# Patient Record
Sex: Female | Born: 1985 | Race: Black or African American | Hispanic: No | Smoking: Never smoker
Health system: Southern US, Community
[De-identification: ages and names within clinical notes are randomized; demographics above are authoritative.]

## PROBLEM LIST (undated history)

## (undated) DIAGNOSIS — O24419 Gestational diabetes mellitus in pregnancy, unspecified control: Secondary | ICD-10-CM

## (undated) DIAGNOSIS — E119 Type 2 diabetes mellitus without complications: Secondary | ICD-10-CM

## (undated) DIAGNOSIS — Z862 Personal history of diseases of the blood and blood-forming organs and certain disorders involving the immune mechanism: Secondary | ICD-10-CM

## (undated) HISTORY — PX: HERNIA REPAIR: SHX51

## (undated) HISTORY — DX: Type 2 diabetes mellitus without complications: E11.9

## (undated) HISTORY — PX: WISDOM TOOTH EXTRACTION: SHX21

---

## 2011-08-25 DIAGNOSIS — A6000 Herpesviral infection of urogenital system, unspecified: Secondary | ICD-10-CM | POA: Insufficient documentation

## 2011-08-25 DIAGNOSIS — J309 Allergic rhinitis, unspecified: Secondary | ICD-10-CM | POA: Insufficient documentation

## 2011-11-15 DIAGNOSIS — O34219 Maternal care for unspecified type scar from previous cesarean delivery: Secondary | ICD-10-CM | POA: Insufficient documentation

## 2011-11-15 DIAGNOSIS — Z803 Family history of malignant neoplasm of breast: Secondary | ICD-10-CM | POA: Insufficient documentation

## 2012-11-13 DIAGNOSIS — N920 Excessive and frequent menstruation with regular cycle: Secondary | ICD-10-CM | POA: Insufficient documentation

## 2012-11-13 HISTORY — DX: Excessive and frequent menstruation with regular cycle: N92.0

## 2015-12-25 ENCOUNTER — Encounter (HOSPITAL_COMMUNITY): Payer: Self-pay | Admitting: Emergency Medicine

## 2015-12-25 DIAGNOSIS — R102 Pelvic and perineal pain: Secondary | ICD-10-CM | POA: Insufficient documentation

## 2015-12-25 DIAGNOSIS — N939 Abnormal uterine and vaginal bleeding, unspecified: Secondary | ICD-10-CM | POA: Diagnosis present

## 2015-12-25 LAB — COMPREHENSIVE METABOLIC PANEL
ALT: 13 U/L — AB (ref 14–54)
AST: 15 U/L (ref 15–41)
Albumin: 3.9 g/dL (ref 3.5–5.0)
Alkaline Phosphatase: 59 U/L (ref 38–126)
Anion gap: 6 (ref 5–15)
BUN: 10 mg/dL (ref 6–20)
CHLORIDE: 107 mmol/L (ref 101–111)
CO2: 26 mmol/L (ref 22–32)
CREATININE: 0.71 mg/dL (ref 0.44–1.00)
Calcium: 9 mg/dL (ref 8.9–10.3)
GFR calc non Af Amer: >60 mL/min (ref >60)
Glucose, Bld: 138 mg/dL — ABNORMAL HIGH (ref 65–99)
POTASSIUM: 3.8 mmol/L (ref 3.5–5.1)
SODIUM: 139 mmol/L (ref 135–145)
Total Bilirubin: 0.8 mg/dL (ref 0.3–1.2)
Total Protein: 6.4 g/dL — ABNORMAL LOW (ref 6.5–8.1)

## 2015-12-25 LAB — CBC WITH DIFFERENTIAL/PLATELET
BASOS ABS: 0 10*3/uL (ref 0.0–0.1)
Basophils Relative: 0 %
EOS ABS: 0.1 10*3/uL (ref 0.0–0.7)
EOS PCT: 1 %
HCT: 36.6 % (ref 36.0–46.0)
Hemoglobin: 12.2 g/dL (ref 12.0–15.0)
Lymphocytes Relative: 29 %
Lymphs Abs: 2.7 10*3/uL (ref 0.7–4.0)
MCH: 30.8 pg (ref 26.0–34.0)
MCHC: 33.3 g/dL (ref 30.0–36.0)
MCV: 92.4 fL (ref 78.0–100.0)
MONO ABS: 0.6 10*3/uL (ref 0.1–1.0)
Monocytes Relative: 6 %
Neutro Abs: 6.1 10*3/uL (ref 1.7–7.7)
Neutrophils Relative %: 64 %
PLATELETS: 282 10*3/uL (ref 150–400)
RBC: 3.96 MIL/uL (ref 3.87–5.11)
RDW: 12.4 % (ref 11.5–15.5)
WBC: 9.4 10*3/uL (ref 4.0–10.5)

## 2015-12-25 LAB — I-STAT BETA HCG BLOOD, ED (MC, WL, AP ONLY): I-stat hCG, quantitative: <5 m[IU]/mL (ref <5)

## 2015-12-25 NOTE — ED Triage Notes (Signed)
Pt c/o vaginal bleeding, onset earlier today.   St's she has been passing large clots. Also c/o cramping

## 2015-12-26 ENCOUNTER — Emergency Department (HOSPITAL_COMMUNITY)
Admission: EM | Admit: 2015-12-26 | Discharge: 2015-12-26 | Disposition: A | Discharge status: Home / Self Care | Payer: Managed Care, Other (non HMO) | Attending: Emergency Medicine | Admitting: Emergency Medicine

## 2015-12-26 ENCOUNTER — Emergency Department (HOSPITAL_COMMUNITY): Payer: Managed Care, Other (non HMO)

## 2015-12-26 DIAGNOSIS — N939 Abnormal uterine and vaginal bleeding, unspecified: Secondary | ICD-10-CM

## 2015-12-26 LAB — HEMOGLOBIN AND HEMATOCRIT, BLOOD
HCT: 34.2 % — ABNORMAL LOW (ref 36.0–46.0)
Hemoglobin: 11.4 g/dL — ABNORMAL LOW (ref 12.0–15.0)

## 2015-12-26 NOTE — ED Notes (Signed)
Pelvic cart/stirrups set up at bedside for EDP pelvic examination .

## 2015-12-26 NOTE — ED Notes (Signed)
Pelvic cart set up outside of room

## 2015-12-26 NOTE — ED Provider Notes (Signed)
[facility] DEPT Provider Note   CSN: [id number] Arrival date & time: 12/25/15  2136   History   Chief Complaint Chief Complaint  Patient presents with  . Vaginal Bleeding    HPI Alexa Rosales is a 30 y.o. female.  HPI   Patient has no significant PMH. She presents to the ER For significant amount of vaginal bleeding. She was seen by her primary provider yesterday and had a pelvic exam done with vaginal swabs checking for STDs as well as a wet prep, neg UA. She states that she has bacterial vaginosis and has begun treatment for it. She used to have an IUD but it came out 3 months ago, her last menstrual period was 2 weeks ago. Today she started bleeding a large amount of blood with clots in it. She states going through 2 large pads every hour which is very unusual for her. She says this has never happened before. She has not been feeling weak, dizzy, nauseous. She denies having any fever, chills, confusion. She does have a mild amount of pelvic cramping other than that denies having any pain.  History reviewed. No pertinent past medical history.  There are no active problems to display for this patient.   Past Surgical History:  Procedure Laterality Date  . CESAREAN SECTION      OB History    No data available       Home Medications    Prior to Admission medications   Not on File    Family History No family history on file.  Social History Social History  Substance Use Topics  . Smoking status: Never Smoker  . Smokeless tobacco: Never Used  . Alcohol use Yes     Allergies   Review of patient's allergies indicates not on file.   Review of Systems Review of Systems  Review of Systems All other systems negative except as documented in the HPI. All pertinent positives and negatives as reviewed in the HPI.  Physical Exam Updated Vital Signs BP 108/65 (BP Location: Right Arm)   Pulse 83   Temp 98 F (36.7 C) (Oral)   Resp 16   Ht 5\' 5"  (1.651 m)    Wt 81.2 kg   LMP 12/25/2015 (Exact Date)   SpO2 98%   BMI 29.79 kg/m   Physical Exam  Constitutional: She appears well-developed and well-nourished. No distress.  HENT:  Head: Normocephalic and atraumatic.  Eyes: Pupils are equal, round, and reactive to light.  Neck: Normal range of motion. Neck supple.  Cardiovascular: Normal rate and regular rhythm.   Pulmonary/Chest: Effort normal.  Abdominal: Soft. Bowel sounds are normal. There is no tenderness.  Genitourinary: There is bleeding in the vagina.  Genitourinary Comments: Large amount of bleeding with coagulated blood within vaginal vault. No tissue clots noted. Cervix is closed. No wounds or lesions noted. No source of bleeding noted.  Neurological: She is alert.  Skin: Skin is warm and dry.  Nursing note and vitals reviewed.    ED Treatments / Results  Labs (all labs ordered are listed, but only abnormal results are displayed) Labs Reviewed  COMPREHENSIVE METABOLIC PANEL - Abnormal; Notable for the following:       Result Value   Glucose, Bld 138 (*)    Total Protein 6.4 (*)    ALT 13 (*)    All other components within normal limits  HEMOGLOBIN AND HEMATOCRIT, BLOOD - Abnormal; Notable for the following:    Hemoglobin 11.4 (*)  HCT 34.2 (*)    All other components within normal limits  CBC WITH DIFFERENTIAL/PLATELET  I-STAT BETA HCG BLOOD, ED ([facility], [facility], [facility] ONLY)    EKG  EKG Interpretation None       Radiology US Transvaginal Non-ob  Result Date: 12/26/2015 CLINICAL DATA:  30 year old female with vaginal bleeding and tissue passing. LMP 12/25/2015 EXAM: TRANSABDOMINAL AND TRANSVAGINAL ULTRASOUND OF PELVIS DOPPLER ULTRASOUND OF OVARIES TECHNIQUE: Both transabdominal and transvaginal ultrasound examinations of the pelvis were performed. Transabdominal technique was performed for global imaging of the pelvis including uterus, ovaries, adnexal regions, and pelvic cul-de-sac. It was necessary to proceed with endovaginal  exam following the transabdominal exam to visualize the endometrium and the ovaries. Color and duplex Doppler ultrasound was utilized to evaluate blood flow to the ovaries. COMPARISON:  None. FINDINGS: Uterus Measurements: 9.5 x 5.4 x 6.0 cm. No fibroids or other mass visualized. Endometrium Thickness: 23 mm.  No focal abnormality visualized. Right ovary Measurements: 4.5 x 2.6 x 3.1 cm. Normal appearance/no adnexal mass. Left ovary Measurements: 5.5 x 3.2 x 3.7 cm. There is a 3.0 x 2.5 x 2.6 cm cyst and a probable additional corpus luteum in the left ovary. Pulsed Doppler evaluation of both ovaries demonstrates normal low-resistance arterial and venous waveforms. Other findings Trace free fluid within the pelvis. IMPRESSION: Unremarkable pelvic ultrasound. Doppler detected flow to both ovaries. Electronically Signed   By: Anner Crete M.D.   On: 12/26/2015 03:29   US Pelvis Complete  Result Date: 12/26/2015 CLINICAL DATA:  30 year old female with vaginal bleeding and tissue passing. LMP 12/25/2015 EXAM: TRANSABDOMINAL AND TRANSVAGINAL ULTRASOUND OF PELVIS DOPPLER ULTRASOUND OF OVARIES TECHNIQUE: Both transabdominal and transvaginal ultrasound examinations of the pelvis were performed. Transabdominal technique was performed for global imaging of the pelvis including uterus, ovaries, adnexal regions, and pelvic cul-de-sac. It was necessary to proceed with endovaginal exam following the transabdominal exam to visualize the endometrium and the ovaries. Color and duplex Doppler ultrasound was utilized to evaluate blood flow to the ovaries. COMPARISON:  None. FINDINGS: Uterus Measurements: 9.5 x 5.4 x 6.0 cm. No fibroids or other mass visualized. Endometrium Thickness: 23 mm.  No focal abnormality visualized. Right ovary Measurements: 4.5 x 2.6 x 3.1 cm. Normal appearance/no adnexal mass. Left ovary Measurements: 5.5 x 3.2 x 3.7 cm. There is a 3.0 x 2.5 x 2.6 cm cyst and a probable additional corpus luteum in the  left ovary. Pulsed Doppler evaluation of both ovaries demonstrates normal low-resistance arterial and venous waveforms. Other findings Trace free fluid within the pelvis. IMPRESSION: Unremarkable pelvic ultrasound. Doppler detected flow to both ovaries. Electronically Signed   By: Anner Crete M.D.   On: 12/26/2015 03:29   [place] Art/ven Flow Abd Pelv Doppler  Result Date: 12/26/2015 CLINICAL DATA:  30 year old female with vaginal bleeding and tissue passing. LMP 12/25/2015 EXAM: TRANSABDOMINAL AND TRANSVAGINAL ULTRASOUND OF PELVIS DOPPLER ULTRASOUND OF OVARIES TECHNIQUE: Both transabdominal and transvaginal ultrasound examinations of the pelvis were performed. Transabdominal technique was performed for global imaging of the pelvis including uterus, ovaries, adnexal regions, and pelvic cul-de-sac. It was necessary to proceed with endovaginal exam following the transabdominal exam to visualize the endometrium and the ovaries. Color and duplex Doppler ultrasound was utilized to evaluate blood flow to the ovaries. COMPARISON:  None. FINDINGS: Uterus Measurements: 9.5 x 5.4 x 6.0 cm. No fibroids or other mass visualized. Endometrium Thickness: 23 mm.  No focal abnormality visualized. Right ovary Measurements: 4.5 x 2.6 x 3.1 cm. Normal appearance/no adnexal mass.  Left ovary Measurements: 5.5 x 3.2 x 3.7 cm. There is a 3.0 x 2.5 x 2.6 cm cyst and a probable additional corpus luteum in the left ovary. Pulsed Doppler evaluation of both ovaries demonstrates normal low-resistance arterial and venous waveforms. Other findings Trace free fluid within the pelvis. IMPRESSION: Unremarkable pelvic ultrasound. Doppler detected flow to both ovaries. Electronically Signed   By: Anner Crete M.D.   On: 12/26/2015 03:29    Procedures Procedures (including critical care time)  Medications Ordered in ED Medications - No data to display   Initial Impression / Assessment and Plan / ED Course  I have reviewed the triage  vital signs and the nursing notes.  Pertinent labs & imaging results that were available during my care of the patient were reviewed by me and considered in my medical decision making (see chart for details).  Clinical Course    The patient's ultrasounds are unremarkable. During the pelvic exam I did not see any source of bleeding and the cervix is closed. She is not having any pain. Her IUD fell out a few months ago and her bleeding may be hormonal. She states that while in the emergency department the bleeding has significantly slowed down. I rechecked her hemoglobin and it did not drop significantly. Her vital signs remained stable she is well-appearing. She has a gynecologist that she will follow up with on Monday.  Medications - No data to display  I discussed results, diagnoses and plan with Lorrin Jackson. They voice there understanding and questions were answered. We discussed follow-up recommendations and return precautions.   Final Clinical Impressions(s) / ED Diagnoses   Final diagnoses:  Vaginal bleeding  Abnormal uterine bleeding (AUB)    New Prescriptions New Prescriptions   No medications on file     Delos Haring, PA-C 12/26/15 [place], DO 12/26/15 [id number]

## 2015-12-26 NOTE — ED Notes (Signed)
Pt went to US  

## 2015-12-29 DIAGNOSIS — N939 Abnormal uterine and vaginal bleeding, unspecified: Secondary | ICD-10-CM | POA: Insufficient documentation

## 2015-12-29 HISTORY — DX: Abnormal uterine and vaginal bleeding, unspecified: N93.9

## 2017-02-25 DIAGNOSIS — J00 Acute nasopharyngitis [common cold]: Secondary | ICD-10-CM | POA: Diagnosis not present

## 2017-02-27 DIAGNOSIS — J209 Acute bronchitis, unspecified: Secondary | ICD-10-CM | POA: Diagnosis not present

## 2017-07-07 ENCOUNTER — Encounter: Payer: Self-pay | Admitting: Family Medicine

## 2017-07-07 ENCOUNTER — Ambulatory Visit (INDEPENDENT_AMBULATORY_CARE_PROVIDER_SITE_OTHER): Payer: 59 | Admitting: Family Medicine

## 2017-07-07 VITALS — BP 114/66 | HR 86 | Temp 98.5°F | Ht 64.25 in | Wt 180.3 lb

## 2017-07-07 DIAGNOSIS — Z13220 Encounter for screening for lipoid disorders: Secondary | ICD-10-CM | POA: Diagnosis not present

## 2017-07-07 DIAGNOSIS — N898 Other specified noninflammatory disorders of vagina: Secondary | ICD-10-CM | POA: Diagnosis not present

## 2017-07-07 DIAGNOSIS — B9689 Other specified bacterial agents as the cause of diseases classified elsewhere: Secondary | ICD-10-CM

## 2017-07-07 DIAGNOSIS — N760 Acute vaginitis: Secondary | ICD-10-CM

## 2017-07-07 DIAGNOSIS — Z0000 Encounter for general adult medical examination without abnormal findings: Secondary | ICD-10-CM

## 2017-07-07 DIAGNOSIS — Z131 Encounter for screening for diabetes mellitus: Secondary | ICD-10-CM | POA: Diagnosis not present

## 2017-07-07 DIAGNOSIS — E669 Obesity, unspecified: Secondary | ICD-10-CM

## 2017-07-07 DIAGNOSIS — D649 Anemia, unspecified: Secondary | ICD-10-CM | POA: Diagnosis not present

## 2017-07-07 DIAGNOSIS — Z1389 Encounter for screening for other disorder: Secondary | ICD-10-CM | POA: Diagnosis not present

## 2017-07-07 DIAGNOSIS — Z7689 Persons encountering health services in other specified circumstances: Secondary | ICD-10-CM

## 2017-07-07 LAB — LIPID PANEL
CHOL/HDL RATIO: 4
Cholesterol: 180 mg/dL (ref 0–200)
HDL: 50.4 mg/dL (ref >39.00)
LDL CALC: 100 mg/dL — AB (ref 0–99)
NONHDL: 129.91
TRIGLYCERIDES: 149 mg/dL (ref 0.0–149.0)
VLDL: 29.8 mg/dL (ref 0.0–40.0)

## 2017-07-07 LAB — COMPREHENSIVE METABOLIC PANEL
ALT: 10 U/L (ref 0–35)
AST: 13 U/L (ref 0–37)
Albumin: 4.1 g/dL (ref 3.5–5.2)
Alkaline Phosphatase: 57 U/L (ref 39–117)
BUN: 14 mg/dL (ref 6–23)
CHLORIDE: 105 meq/L (ref 96–112)
CO2: 26 meq/L (ref 19–32)
Calcium: 9.2 mg/dL (ref 8.4–10.5)
Creatinine, Ser: 0.76 mg/dL (ref 0.40–1.20)
GFR: 112.69 mL/min (ref >60.00)
GLUCOSE: 95 mg/dL (ref 70–99)
POTASSIUM: 3.8 meq/L (ref 3.5–5.1)
SODIUM: 138 meq/L (ref 135–145)
Total Bilirubin: 0.6 mg/dL (ref 0.2–1.2)
Total Protein: 7.3 g/dL (ref 6.0–8.3)

## 2017-07-07 LAB — CBC WITH DIFFERENTIAL/PLATELET
BASOS PCT: 0.4 % (ref 0.0–3.0)
Basophils Absolute: 0 10*3/uL (ref 0.0–0.1)
Eosinophils Absolute: 0.1 10*3/uL (ref 0.0–0.7)
Eosinophils Relative: 0.9 % (ref 0.0–5.0)
HCT: 35.8 % — ABNORMAL LOW (ref 36.0–46.0)
HEMOGLOBIN: 12.1 g/dL (ref 12.0–15.0)
Lymphocytes Relative: 29.6 % (ref 12.0–46.0)
Lymphs Abs: 2.1 10*3/uL (ref 0.7–4.0)
MCHC: 33.7 g/dL (ref 30.0–36.0)
MCV: 89.9 fl (ref 78.0–100.0)
MONOS PCT: 7.5 % (ref 3.0–12.0)
Monocytes Absolute: 0.5 10*3/uL (ref 0.1–1.0)
NEUTROS ABS: 4.4 10*3/uL (ref 1.4–7.7)
Neutrophils Relative %: 61.6 % (ref 43.0–77.0)
PLATELETS: 371 10*3/uL (ref 150.0–400.0)
RBC: 3.99 Mil/uL (ref 3.87–5.11)
RDW: 13.4 % (ref 11.5–15.5)
WBC: 7.1 10*3/uL (ref 4.0–10.5)

## 2017-07-07 LAB — HEMOGLOBIN A1C: Hgb A1c MFr Bld: 5.8 % (ref 4.6–6.5)

## 2017-07-07 LAB — FERRITIN: Ferritin: 3.1 ng/mL — ABNORMAL LOW (ref 10.0–291.0)

## 2017-07-07 NOTE — Progress Notes (Signed)
Subjective:    Patient ID: Alexa Rosales, female    DOB: [birth year], 32 y.o.   MRN: [id number]  HPI This is a 32 yo female who presents today to establish care, she is accompanied by her two children. She is married. She works at [employer] as Therapist, sports on Engineer, manufacturing systems. Currently working on [employer], will finish soon.   Last CPE- 12/02/16 Mammo- NA Pap- 12/02/16, negative, results state HPV "pending" Tdap- 08/07/13 Flu- annual  No past medical history on file. Past Surgical History:  Procedure Laterality Date  . CESAREAN SECTION    . HERNIA REPAIR    . WISDOM TOOTH EXTRACTION     Family History  Problem Relation Age of Onset  . Diabetes Mother   . Hypertension Mother   . Sarcoidosis Father   . Asthma Sister   . Colon cancer Maternal Grandmother   . Breast cancer Paternal Grandmother    Social History   Tobacco Use  . Smoking status: Never Smoker  . Smokeless tobacco: Never Used  Substance Use Topics  . Alcohol use: Yes  . Drug use: No      Review of Systems  Constitutional: Negative for fatigue.       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 Increased soda and fast food intake.   Respiratory: Negative for cough and shortness of breath.   Cardiovascular: Positive for leg swelling (occasional at end of shift). Negative for chest pain.  Gastrointestinal: Negative.   Endocrine: Negative.        Family history of DM, concern with her recent weight gain.   Genitourinary: Positive for menstrual problem (periods heavy for 3 days, has had anemia in past) and vaginal discharge (thin, malodorus for several weeks, history of BV).  Musculoskeletal: Negative.   Allergic/Immunologic: Negative.   Neurological: Negative for headaches.  Hematological: Negative.   Psychiatric/Behavioral: Negative.        Objective:   Physical Exam Physical Exam  Constitutional: Oriented to person, place, and time. She appears well-developed and well-nourished.  HENT:  Head: Normocephalic and  atraumatic.  Eyes: Conjunctivae are normal.  Neck: Normal range of motion. Neck supple.  Cardiovascular: Normal rate, regular rhythm and normal heart sounds.   Pulmonary/Chest: Effort normal and breath sounds normal.  Musculoskeletal: Normal range of motion.  Neurological: Alert and oriented to person, place, and time.  Skin: Skin is warm and dry.  Psychiatric: Normal mood and affect. Behavior is normal. Judgment and thought content normal.  Vitals reviewed.  BP 114/66   Pulse 86   Temp 98.5 F (36.9 C) (Oral)   Ht 5' 4.25" (1.632 m)   Wt 180 lb 5 oz (81.8 kg)   LMP 06/21/2017   SpO2 97%   BMI 30.71 kg/m  Wt Readings from Last 3 Encounters:  07/07/17 180 lb 5 oz (81.8 kg)  12/25/15 179 lb (81.2 kg)   Wet prep self collected.     Assessment & Plan:  1. Encounter to establish care - Follow up to be determined by labs  2. Annual physical exam -- Discussed and encouraged healthy lifestyle choices- adequate sleep, regular exercise, stress management and healthy food choices.   3. Vaginal discharge - WET PREP BY MOLECULAR PROBE  4. Screening for lipid disorders - Lipid panel  5. Screening for nephropathy - Comprehensive metabolic panel - Hemoglobin A1c - Ferritin - CBC with Differential/Platelet - Lipid panel  6. Screening for diabetes mellitus - Hemoglobin A1c  7. Anemia, unspecified type - Comprehensive metabolic panel -  Hemoglobin A1c - Ferritin - CBC with Differential/Platelet - Lipid panel  8. Obesity (BMI 30-39.9) - discussed diet and encouraged her to avoid calorie containing beverages, fast food, processed foods - Comprehensive metabolic panel - Hemoglobin A1c - Ferritin - CBC with Differential/Platelet - Lipid panel   Clarene Reamer, FNP-BC  [facility]  07/08/2017 10:31 AM

## 2017-07-07 NOTE — Patient Instructions (Signed)
It was a pleasure to meet you today! I look forward to partnering with you for your health care needs  I will notify you of lab results

## 2017-07-08 ENCOUNTER — Encounter: Payer: Self-pay | Admitting: Family Medicine

## 2017-07-08 LAB — WET PREP BY MOLECULAR PROBE
CANDIDA SPECIES: NOT DETECTED
MICRO NUMBER:: 90363295
SPECIMEN QUALITY:: ADEQUATE
TRICHOMONAS VAG: NOT DETECTED

## 2017-07-10 MED ORDER — METRONIDAZOLE 500 MG PO TABS: 500.0000 mg | ORAL_TABLET | Freq: Three times a day (TID) | ORAL | 0 refills | Status: DC

## 2017-07-10 NOTE — Addendum Note (Signed)
Addended by: Clarene Reamer B on: 07/10/2017 08:13 AM   Modules accepted: Orders

## 2017-08-07 ENCOUNTER — Ambulatory Visit (INDEPENDENT_AMBULATORY_CARE_PROVIDER_SITE_OTHER): Payer: 59 | Admitting: Primary Care

## 2017-08-07 ENCOUNTER — Encounter: Payer: Self-pay | Admitting: Primary Care

## 2017-08-07 VITALS — BP 114/76 | HR 81 | Temp 98.0°F | Ht 64.25 in | Wt 184.2 lb

## 2017-08-07 DIAGNOSIS — B9689 Other specified bacterial agents as the cause of diseases classified elsewhere: Secondary | ICD-10-CM | POA: Diagnosis not present

## 2017-08-07 DIAGNOSIS — J019 Acute sinusitis, unspecified: Secondary | ICD-10-CM

## 2017-08-07 MED ORDER — AMOXICILLIN-POT CLAVULANATE 875-125 MG PO TABS: 1.0000 | ORAL_TABLET | Freq: Two times a day (BID) | ORAL | 0 refills | Status: DC

## 2017-08-07 NOTE — Progress Notes (Signed)
Subjective:    Patient ID: Alexa Rosales, female    DOB: 1986-03-20, 31 y.o.   MRN: [id number]  HPI  [name] is a 32 year old female with a history of allergic rhinitis who presents today with a chief complaint of sinus pressure.  She also reports nasal congestion, sore throat. Her symptoms began one week ago. She's been taking Zyrtec, Flonase with some improvement but then symptoms persisted over the past 24-48 hours. She's been expelling thick green mucus from her nasal cavity for the last several days. She denies fevers, cough. Overall she's feeling worse.   Review of Systems  Constitutional: Positive for fatigue. Negative for fever.  HENT: Positive for congestion, sinus pain and sore throat.   Respiratory: Negative for cough and shortness of breath.        No past medical history on file.   Social History   Socioeconomic History  . Marital status: Married    Spouse name: Not on file  . Number of children: Not on file  . Years of education: Not on file  . Highest education level: Not on file  Occupational History  . Not on file  Social Needs  . Financial resource strain: Not on file  . Food insecurity:    Worry: Not on file    Inability: Not on file  . Transportation needs:    Medical: Not on file    Non-medical: Not on file  Tobacco Use  . Smoking status: Never Smoker  . Smokeless tobacco: Never Used  Substance and Sexual Activity  . Alcohol use: Yes  . Drug use: No  . Sexual activity: Yes  Lifestyle  . Physical activity:    Days per week: Not on file    Minutes per session: Not on file  . Stress: Not on file  Relationships  . Social connections:    Talks on phone: Not on file    Gets together: Not on file    Attends religious service: Not on file    Active member of club or organization: Not on file    Attends meetings of clubs or organizations: Not on file    Relationship status: Not on file  . Intimate partner violence:    Fear of current or ex  partner: Not on file    Emotionally abused: Not on file    Physically abused: Not on file    Forced sexual activity: Not on file  Other Topics Concern  . Not on file  Social History Narrative  . Not on file    Past Surgical History:  Procedure Laterality Date  . CESAREAN SECTION    . HERNIA REPAIR    . WISDOM TOOTH EXTRACTION      Family History  Problem Relation Age of Onset  . Diabetes Mother   . Hypertension Mother   . Sarcoidosis Father   . Asthma Sister   . Colon cancer Maternal Grandmother   . Breast cancer Paternal Grandmother     Allergies  Allergen Reactions  . Sulfa Antibiotics     Current Outpatient Medications on File Prior to Visit  Medication Sig Dispense Refill  . cetirizine (ZYRTEC) 5 MG tablet Take 5 mg by mouth daily.    . metroNIDAZOLE (FLAGYL) 500 MG tablet Take 1 tablet (500 mg total) by mouth 3 (three) times daily. 21 tablet 0  . NON FORMULARY Vita fusion    . XULANE 150-35 MCG/24HR transdermal patch   1   No current facility-administered  medications on file prior to visit.     BP 114/76   Pulse 81   Temp 98 F (36.7 C) (Oral)   Ht 5' 4.25" (1.632 m)   Wt 184 lb 4 oz (83.6 kg)   LMP 07/30/2017   SpO2 99%   BMI 31.38 kg/m    Objective:   Physical Exam  Constitutional: She appears well-nourished. She appears ill.  HENT:  Right Ear: Tympanic membrane and ear canal normal.  Left Ear: Tympanic membrane and ear canal normal.  Nose: Mucosal edema present. Right sinus exhibits maxillary sinus tenderness. Right sinus exhibits no frontal sinus tenderness. Left sinus exhibits maxillary sinus tenderness. Left sinus exhibits no frontal sinus tenderness.  Mouth/Throat: Oropharynx is clear and moist.  Eyes: Conjunctivae are normal.  Neck: Neck supple.  Cardiovascular: Normal rate and regular rhythm.  Pulmonary/Chest: Effort normal and breath sounds normal. She has no wheezes. She has no rales.  Lymphadenopathy:    She has no cervical  adenopathy.  Skin: Skin is warm and dry.          Assessment & Plan:  Acute Sinusitis:  Sinus pressure, nasal congestion x 7 days. Temporary improvement with OTC treatment, now feeling worse. Exam and HPI consistent for bacterial involvement. Rx for Augmentin course sent to pharmacy. Continue Zyrtec daily, Flonase PRN. Fluids, rest, follow up PRN.  Pleas Koch, NP

## 2017-08-07 NOTE — Patient Instructions (Signed)
Start Augmentin antibiotics for the infection Take 1 tablet by mouth twice daily for 10 days.  Continue Zyrtec daily and Flonase as needed.  Make sure to drink plenty of fluids and rest.  It was a pleasure meeting you!

## 2017-11-22 ENCOUNTER — Ambulatory Visit: Payer: 59 | Admitting: Family Medicine

## 2017-11-22 DIAGNOSIS — Z0289 Encounter for other administrative examinations: Secondary | ICD-10-CM

## 2018-02-01 ENCOUNTER — Ambulatory Visit (INDEPENDENT_AMBULATORY_CARE_PROVIDER_SITE_OTHER): Payer: 59 | Admitting: Internal Medicine

## 2018-02-01 ENCOUNTER — Encounter: Payer: Self-pay | Admitting: Internal Medicine

## 2018-02-01 VITALS — BP 114/78 | HR 83 | Temp 98.0°F | Wt 193.0 lb

## 2018-02-01 DIAGNOSIS — N898 Other specified noninflammatory disorders of vagina: Secondary | ICD-10-CM

## 2018-02-01 DIAGNOSIS — R350 Frequency of micturition: Secondary | ICD-10-CM | POA: Diagnosis not present

## 2018-02-01 LAB — POC URINALSYSI DIPSTICK (AUTOMATED)
BILIRUBIN UA: NEGATIVE
GLUCOSE UA: NEGATIVE
Ketones, UA: NEGATIVE
Leukocytes, UA: NEGATIVE
NITRITE UA: NEGATIVE
Protein, UA: NEGATIVE
RBC UA: NEGATIVE
Spec Grav, UA: >=1.03 — AB (ref 1.010–1.025)
Urobilinogen, UA: 0.2 E.U./dL
pH, UA: 6 (ref 5.0–8.0)

## 2018-02-01 NOTE — Addendum Note (Signed)
Addended by: Lurlean Nanny on: 02/01/2018 08:52 AM   Modules accepted: Orders

## 2018-02-01 NOTE — Progress Notes (Signed)
Subjective:    Patient ID: Alexa Rosales, female    DOB: 1985-06-18, 32 y.o.   MRN: [id number]  HPI  Pt presents to the clinic today with c/o urinary frequency and vaginal discharge. She reports she noticed this 2-3 weeks ago. She denies urgency, dysuria or blood in her urine. She reports the discharge is thick white/yellow with a foul odor. She denies vaginal itching or irritation. She denies pelvic pain, fever, chills, or abnormal bleeding. She is sexually active but not concerned about STD's. Her LMP was 01/19/18. She has tried Monistat OTC without any relief.  Review of Systems      No past medical history on file.  Current Outpatient Medications  Medication Sig Dispense Refill  . cetirizine (ZYRTEC) 5 MG tablet Take 5 mg by mouth daily.    . NON FORMULARY Vita fusion    . XULANE 150-35 MCG/24HR transdermal patch   1   No current facility-administered medications for this visit.     Allergies  Allergen Reactions  . Sulfa Antibiotics     Family History  Problem Relation Age of Onset  . Diabetes Mother   . Hypertension Mother   . Sarcoidosis Father   . Asthma Sister   . Colon cancer Maternal Grandmother   . Breast cancer Paternal Grandmother     Social History   Socioeconomic History  . Marital status: Married    Spouse name: Not on file  . Number of children: Not on file  . Years of education: Not on file  . Highest education level: Not on file  Occupational History  . Not on file  Social Needs  . Financial resource strain: Not on file  . Food insecurity:    Worry: Not on file    Inability: Not on file  . Transportation needs:    Medical: Not on file    Non-medical: Not on file  Tobacco Use  . Smoking status: Never Smoker  . Smokeless tobacco: Never Used  Substance and Sexual Activity  . Alcohol use: Yes  . Drug use: No  . Sexual activity: Yes  Lifestyle  . Physical activity:    Days per week: Not on file    Minutes per session: Not on file  .  Stress: Not on file  Relationships  . Social connections:    Talks on phone: Not on file    Gets together: Not on file    Attends religious service: Not on file    Active member of club or organization: Not on file    Attends meetings of clubs or organizations: Not on file    Relationship status: Not on file  . Intimate partner violence:    Fear of current or ex partner: Not on file    Emotionally abused: Not on file    Physically abused: Not on file    Forced sexual activity: Not on file  Other Topics Concern  . Not on file  Social History Narrative  . Not on file     Constitutional: Denies fever, malaise, fatigue, headache or abrupt weight changes.  Gastrointestinal: Denies abdominal pain, bloating, constipation, diarrhea or blood in the stool.  GU: Pt reports urinary frequency, vaginal discharge and odor. Denies urgency, pain with urination, burning sensation, blood in urine.  No other specific complaints in a complete review of systems (except as listed in HPI above).  Objective:   Physical Exam  Pulse 83   Temp 98 F (36.7 C) (Oral)   Wt  193 lb (87.5 kg)   LMP 01/17/2018   SpO2 98%   BMI 32.87 kg/m  Wt Readings from Last 3 Encounters:  02/01/18 193 lb (87.5 kg)  08/07/17 184 lb 4 oz (83.6 kg)  07/07/17 180 lb 5 oz (81.8 kg)    General: Appears her stated age, obese, in NAD. Abdomen: Soft and nontender. Normal bowel sounds. No distention or masses noted. No CVA tenderness noted. Pelvic: Self swabbed. Neurological: Alert and oriented.    BMET    Component Value Date/Time   NA 138 07/07/2017 1533   K 3.8 07/07/2017 1533   CL 105 07/07/2017 1533   CO2 26 07/07/2017 1533   GLUCOSE 95 07/07/2017 1533   BUN 14 07/07/2017 1533   CREATININE 0.76 07/07/2017 1533   CALCIUM 9.2 07/07/2017 1533   GFRNONAA >60 12/25/2015 2236   GFRAA >60 12/25/2015 2236    Lipid Panel     Component Value Date/Time   CHOL 180 07/07/2017 1533   TRIG 149.0 07/07/2017 1533    HDL 50.40 07/07/2017 1533   CHOLHDL 4 07/07/2017 1533   VLDL 29.8 07/07/2017 1533   LDLCALC 100 (H) 07/07/2017 1533    CBC    Component Value Date/Time   WBC 7.1 07/07/2017 1533   RBC 3.99 07/07/2017 1533   HGB 12.1 07/07/2017 1533   HCT 35.8 (L) 07/07/2017 1533   PLT 371.0 07/07/2017 1533   MCV 89.9 07/07/2017 1533   MCH 30.8 12/25/2015 2236   MCHC 33.7 07/07/2017 1533   RDW 13.4 07/07/2017 1533   LYMPHSABS 2.1 07/07/2017 1533   MONOABS 0.5 07/07/2017 1533   EOSABS 0.1 07/07/2017 1533   BASOSABS 0.0 07/07/2017 1533    Hgb A1C Lab Results  Component Value Date   HGBA1C 5.8 07/07/2017            Assessment & Plan:   Urinary Frequency, Vaginal Discharge, Odor:  Urinalysis: normal Will not send urine culture Will obtain send off wet prep She declines STD screening at this time  Will follow up after labs are back, return precautions discussed. Webb Silversmith, NP

## 2018-02-01 NOTE — Patient Instructions (Signed)

## 2018-02-02 ENCOUNTER — Telehealth: Payer: Self-pay | Admitting: Family Medicine

## 2018-02-02 ENCOUNTER — Other Ambulatory Visit: Payer: Self-pay | Admitting: Internal Medicine

## 2018-02-02 LAB — WET PREP BY MOLECULAR PROBE
Candida species: NOT DETECTED
MICRO NUMBER: 91249760
SPECIMEN QUALITY: ADEQUATE
TRICHOMONAS VAG: NOT DETECTED

## 2018-02-02 MED ORDER — METRONIDAZOLE 0.75 % VA GEL: 1.0000 | Freq: Two times a day (BID) | VAGINAL | 0 refills | Status: DC

## 2018-02-02 NOTE — Telephone Encounter (Signed)
Copied from [place] [phone]. Topic: Quick Communication - See Telephone Encounter >> Feb 02, 2018  5:41 PM Blase Mess A wrote: CRM for notification. See Telephone encounter for: 02/02/18. Patient was prescribed metroNIDAZOLE (METROGEL) 0.75 % vaginal gel [[id number]] however her insurance will not cover until her ductable is met.  Patient is requesting the pill form sent to [facility] on [address] in [place]. Please advise [phone]

## 2018-02-05 MED ORDER — METRONIDAZOLE 500 MG PO TABS: 500.0000 mg | ORAL_TABLET | Freq: Two times a day (BID) | ORAL | 0 refills | Status: DC

## 2018-02-05 NOTE — Telephone Encounter (Signed)
Pt called about medication being sent to [facility] - please send to [facility] as soon as possible.  Pt would like to start taking this medication

## 2018-02-05 NOTE — Addendum Note (Signed)
Addended by: Jearld Fenton on: 02/05/2018 08:43 AM   Modules accepted: Orders

## 2018-02-05 NOTE — Telephone Encounter (Signed)
Flagyl sent to pharmacy. Please advise no alcohol intake while taking this medication.

## 2018-02-06 MED ORDER — METRONIDAZOLE 500 MG PO TABS: 500.0000 mg | ORAL_TABLET | Freq: Two times a day (BID) | ORAL | 0 refills | Status: DC

## 2018-02-06 NOTE — Addendum Note (Signed)
Addended by: Helene Shoe on: 02/06/2018 11:16 AM   Modules accepted: Orders

## 2018-02-06 NOTE — Telephone Encounter (Signed)
Medication was sent to the incorrect pharmacy. Pt would like to have medication sent to [facility] on [address] in [place] instead.

## 2018-02-06 NOTE — Telephone Encounter (Signed)
Pt is not working today and the v/m is full. I spoke with [place] at [facility] and cancelled the flagyl rx. Sent flagyl rx electronically to [address].

## 2018-02-06 NOTE — Telephone Encounter (Signed)
Rx sent to wrong pharmacy   See attached.

## 2018-04-04 ENCOUNTER — Other Ambulatory Visit: Payer: Self-pay | Admitting: Family Medicine

## 2018-04-04 ENCOUNTER — Ambulatory Visit (INDEPENDENT_AMBULATORY_CARE_PROVIDER_SITE_OTHER): Payer: 59 | Admitting: Family Medicine

## 2018-04-04 ENCOUNTER — Encounter: Payer: Self-pay | Admitting: Family Medicine

## 2018-04-04 VITALS — BP 118/70 | HR 86 | Temp 98.1°F | Ht 64.25 in | Wt 196.8 lb

## 2018-04-04 DIAGNOSIS — D500 Iron deficiency anemia secondary to blood loss (chronic): Secondary | ICD-10-CM

## 2018-04-04 DIAGNOSIS — N644 Mastodynia: Secondary | ICD-10-CM

## 2018-04-04 DIAGNOSIS — E669 Obesity, unspecified: Secondary | ICD-10-CM | POA: Diagnosis not present

## 2018-04-04 DIAGNOSIS — R5383 Other fatigue: Secondary | ICD-10-CM | POA: Diagnosis not present

## 2018-04-04 DIAGNOSIS — N6320 Unspecified lump in the left breast, unspecified quadrant: Secondary | ICD-10-CM

## 2018-04-04 DIAGNOSIS — E559 Vitamin D deficiency, unspecified: Secondary | ICD-10-CM

## 2018-04-04 LAB — CBC WITH DIFFERENTIAL/PLATELET
Basophils Absolute: 0 10*3/uL (ref 0.0–0.1)
Basophils Relative: 0.5 % (ref 0.0–3.0)
EOS PCT: 1.2 % (ref 0.0–5.0)
Eosinophils Absolute: 0.1 10*3/uL (ref 0.0–0.7)
HEMATOCRIT: 41.8 % (ref 36.0–46.0)
Hemoglobin: 14.3 g/dL (ref 12.0–15.0)
LYMPHS ABS: 1.8 10*3/uL (ref 0.7–4.0)
Lymphocytes Relative: 28.5 % (ref 12.0–46.0)
MCHC: 34.2 g/dL (ref 30.0–36.0)
MCV: 91.6 fl (ref 78.0–100.0)
MONOS PCT: 8 % (ref 3.0–12.0)
Monocytes Absolute: 0.5 10*3/uL (ref 0.1–1.0)
NEUTROS ABS: 3.9 10*3/uL (ref 1.4–7.7)
NEUTROS PCT: 61.8 % (ref 43.0–77.0)
Platelets: 306 10*3/uL (ref 150.0–400.0)
RBC: 4.56 Mil/uL (ref 3.87–5.11)
RDW: 12.6 % (ref 11.5–15.5)
WBC: 6.4 10*3/uL (ref 4.0–10.5)

## 2018-04-04 LAB — FERRITIN: Ferritin: 7.7 ng/mL — ABNORMAL LOW (ref 10.0–291.0)

## 2018-04-04 LAB — TSH: TSH: 1.58 u[IU]/mL (ref 0.35–4.50)

## 2018-04-04 LAB — VITAMIN D 25 HYDROXY (VIT D DEFICIENCY, FRACTURES): VITD: 16.44 ng/mL — AB (ref 30.00–100.00)

## 2018-04-04 NOTE — Patient Instructions (Signed)
Good to see you today  Stop at desk to schedule mammogram then go to the lab

## 2018-04-04 NOTE — Progress Notes (Signed)
Subjective:    Patient ID: Alexa Rosales, female    DOB: 1986-02-01, 32 y.o.   MRN: [id number]  HPI This is a 32 yo female who presents today with breast pain for several weeks, left greater than right. Palpated swollen area on left side a couple of weeks ago. Not sure if related to menstrual cycle.  She has no more than 1 caffeinated beverage a day. Low ferritin- not consistently taking iron, was good about taking for a month. 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 Works at [employer].   No past medical history on file. Past Surgical History:  Procedure Laterality Date  . CESAREAN SECTION    . HERNIA REPAIR    . WISDOM TOOTH EXTRACTION     Family History  Problem Relation Age of Onset  . Diabetes Mother   . Hypertension Mother   . Sarcoidosis Father   . Asthma Sister   . Colon cancer Maternal Grandmother   . Breast cancer Paternal Grandmother    Social History   Tobacco Use  . Smoking status: Never Smoker  . Smokeless tobacco: Never Used  Substance Use Topics  . Alcohol use: Yes  . Drug use: No      Review of Systems Per HPI    Objective:   Physical Exam Vitals signs reviewed.  Constitutional:      Appearance: Normal appearance. She is obese.  HENT:     Head: Normocephalic and atraumatic.  Cardiovascular:     Rate and Rhythm: Normal rate.  Pulmonary:     Effort: Pulmonary effort is normal.  Chest:     Breasts: Breasts are symmetrical.        Right: Normal.        Left: Normal.  Lymphadenopathy:     Upper Body:     Right upper body: No supraclavicular, axillary or pectoral adenopathy.     Left upper body: No supraclavicular, axillary or pectoral adenopathy.  Neurological:     Mental Status: She is alert and oriented to person, place, and time.  Psychiatric:        Mood and Affect: Mood normal.        Behavior: Behavior normal.        Thought Content: Thought content normal.        Judgment: Judgment normal.       BP  118/70 (BP Location: Right Arm, Patient Position: Sitting, Cuff Size: Normal)   Pulse 86   Temp 98.1 F (36.7 C) (Oral)   Ht 5' 4.25" (1.632 m)   Wt 196 lb 12.8 oz (89.3 kg)   LMP 03/26/2018   SpO2 98%   BMI 33.52 kg/m  Wt Readings from Last 3 Encounters:  04/04/18 196 lb 12.8 oz (89.3 kg)  02/01/18 193 lb (87.5 kg)  08/07/17 184 lb 4 oz (83.6 kg)       Assessment & Plan:  1. Breast pain - MM DIAG BREAST TOMO BILATERAL; Future  2. Breast mass, left -This was noted by patient, I did not palpate a mass on physical exam today - MM DIAG BREAST TOMO BILATERAL; Future  3. Iron deficiency anemia due to chronic blood loss -She has been noncompliant with taking her iron as well as following up, will check labs today - CBC with Differential - Ferritin  4. Fatigue, unspecified type - CBC with Differential - Ferritin - TSH - Vitamin D, 25-hydroxy  5. Obesity (BMI 30.0-34.9) - TSH - Vitamin D, 25-hydroxy  Clarene Reamer, FNP-BC  [facility]  04/04/2018 1:22 PM

## 2018-04-06 MED ORDER — VITAMIN D3 1.25 MG (50000 UT) PO TABS: 1.0000 | ORAL_TABLET | ORAL | 3 refills | Status: DC

## 2018-04-06 NOTE — Addendum Note (Signed)
Addended by: Clarene Reamer B on: 04/06/2018 07:51 AM   Modules accepted: Orders

## 2018-04-17 ENCOUNTER — Ambulatory Visit
Admission: RE | Admit: 2018-04-17 | Discharge: 2018-04-17 | Disposition: A | Discharge status: Home / Self Care | Payer: 59 | Source: Ambulatory Visit | Attending: Family Medicine | Admitting: Family Medicine

## 2018-04-17 DIAGNOSIS — N644 Mastodynia: Secondary | ICD-10-CM | POA: Insufficient documentation

## 2018-04-17 DIAGNOSIS — N6320 Unspecified lump in the left breast, unspecified quadrant: Secondary | ICD-10-CM

## 2018-04-17 DIAGNOSIS — R928 Other abnormal and inconclusive findings on diagnostic imaging of breast: Secondary | ICD-10-CM | POA: Diagnosis not present

## 2018-04-18 ENCOUNTER — Ambulatory Visit
Admission: EM | Admit: 2018-04-18 | Discharge: 2018-04-18 | Disposition: A | Discharge status: Home / Self Care | Payer: No Typology Code available for payment source | Attending: Family Medicine | Admitting: Family Medicine

## 2018-04-18 ENCOUNTER — Encounter: Payer: Self-pay | Admitting: Emergency Medicine

## 2018-04-18 ENCOUNTER — Other Ambulatory Visit: Payer: Self-pay

## 2018-04-18 ENCOUNTER — Ambulatory Visit (INDEPENDENT_AMBULATORY_CARE_PROVIDER_SITE_OTHER): Payer: No Typology Code available for payment source

## 2018-04-18 DIAGNOSIS — M25572 Pain in left ankle and joints of left foot: Secondary | ICD-10-CM

## 2018-04-18 DIAGNOSIS — S93402A Sprain of unspecified ligament of left ankle, initial encounter: Secondary | ICD-10-CM | POA: Diagnosis not present

## 2018-04-18 HISTORY — DX: Personal history of diseases of the blood and blood-forming organs and certain disorders involving the immune mechanism: Z86.2

## 2018-04-18 MED ORDER — MELOXICAM 15 MG PO TABS: 15.0000 mg | ORAL_TABLET | Freq: Every day | ORAL | 0 refills | Status: DC | PRN

## 2018-04-18 NOTE — Discharge Instructions (Signed)
Rest, ice, compression, elevation.  Medication as needed.  Take care  Dr. Lacinda Axon

## 2018-04-18 NOTE — ED Provider Notes (Signed)
[facility]    CSN: [id number] Arrival date & time: 04/18/18  4315  History   Chief Complaint Chief Complaint  Patient presents with  . Ankle Injury    left DOI 04/17/18   HPI  33 year old female presents with left ankle pain.  Patient states that she was running away from a dog last night because she was startled.  She twisted her ankle in doing so.  She states that she has not had much swelling.  She does note that she has had a moderate to severe pain.  Worse with activity.  He has rested and provided compression with some improvement.  No reported bruising.  No other associated symptoms.  No other complaints.  PMH, Surgical Hx, Family Hx, Social History reviewed and updated as below.  Past Medical History:  Diagnosis Date  . History of anemia    Patient Active Problem List   Diagnosis Date Noted  . Abnormal uterine bleeding (AUB) 12/29/2015  . Family history of breast cancer in female 11/15/2011  . Allergic rhinitis 08/25/2011  . Genital herpes 08/25/2011   Past Surgical History:  Procedure Laterality Date  . CESAREAN SECTION    . HERNIA REPAIR    . WISDOM TOOTH EXTRACTION     OB History   No obstetric history on file.     Home Medications    Prior to Admission medications   Medication Sig Start Date End Date Taking? Authorizing Provider  cetirizine (ZYRTEC) 5 MG tablet Take 5 mg by mouth daily.   Yes [provider]  Cholecalciferol (VITAMIN D3) 1.25 MG (50000 UT) TABS Take 1 tablet by mouth every 7 (seven) days. 04/06/18  Yes Elby Beck, FNP  ferrous sulfate 325 (65 FE) MG tablet Take 325 mg by mouth daily with breakfast.   Yes [provider]  NON FORMULARY Vita fusion   Yes [provider]  Marilu Favre 150-35 MCG/24HR transdermal patch  05/11/17  Yes [provider]  meloxicam (MOBIC) 15 MG tablet Take 1 tablet (15 mg total) by mouth daily as needed. 04/18/18   Coral Spikes, DO    Family History Family  History  Problem Relation Age of Onset  . Diabetes Mother   . Hypertension Mother   . Sarcoidosis Father   . Asthma Sister   . Colon cancer Maternal Grandmother   . Breast cancer Paternal Grandmother   . Breast cancer Maternal Aunt        great MAT    Social History Social History   Tobacco Use  . Smoking status: Never Smoker  . Smokeless tobacco: Never Used  Substance Use Topics  . Alcohol use: Yes    Comment: occassional  . Drug use: No     Allergies   Sulfa antibiotics   Review of Systems Review of Systems  Constitutional: Negative.   Musculoskeletal:       Left ankle pain, injury.   Physical Exam Triage Vital Signs ED Triage Vitals  Enc Vitals Group     BP 04/18/18 0943 116/76     Pulse Rate 04/18/18 0943 92     Resp 04/18/18 0943 16     Temp 04/18/18 0943 98 F (36.7 C)     Temp Source 04/18/18 0943 Oral     SpO2 04/18/18 0943 100 %     Weight 04/18/18 0944 189 lb (85.7 kg)     Height 04/18/18 0944 5\' 5"  (1.651 m)     Head Circumference --  Peak Flow --      Pain Score 04/18/18 0944 8     Pain Loc --      Pain Edu? --      Excl. in [place]? --    Updated Vital Signs BP 116/76 (BP Location: Left Arm)   Pulse 92   Temp 98 F (36.7 C) (Oral)   Resp 16   Ht 5\' 5"  (1.651 m)   Wt 85.7 kg   LMP 03/26/2018   SpO2 100%   BMI 31.45 kg/m   Visual Acuity Right Eye Distance:   Left Eye Distance:   Bilateral Distance:    Right Eye Near:   Left Eye Near:    Bilateral Near:     Physical Exam Vitals signs and nursing note reviewed.  Constitutional:      General: She is not in acute distress. HENT:     Head: Normocephalic and atraumatic.  Cardiovascular:     Rate and Rhythm: Normal rate and regular rhythm.  Pulmonary:     Effort: Pulmonary effort is normal. No respiratory distress.  Musculoskeletal:     Comments: Left ankle -no apparent swelling.  No tenderness on the medial aspect.  Patient does have tenderness slightly around the lateral  malleolus.  Neurological:     Mental Status: She is alert.  Psychiatric:        Mood and Affect: Mood normal.        Behavior: Behavior normal.    UC Treatments / Results  Labs (all labs ordered are listed, but only abnormal results are displayed) Labs Reviewed - No data to display  EKG None  Radiology Dg Ankle Complete Left  Result Date: 04/18/2018 CLINICAL DATA:  Pain after twisting ankle. EXAM: LEFT ANKLE COMPLETE - 3+ VIEW COMPARISON:  None. FINDINGS: Negative for fracture or dislocation. No focal soft tissue abnormality. Calcaneal spurring along the Achilles tendon insertion and the plantar aspect of the calcaneus. Normal alignment in the left ankle. IMPRESSION: 1. No acute bone abnormality to left ankle. 2. Calcaneal spurring. Electronically Signed   By: Markus Daft M.D.   On: 04/18/2018 10:22   US Breast Ltd Uni Left Inc Axilla  Result Date: 04/17/2018 CLINICAL DATA:  Intermittent focal pain in the posterior aspect of the outer left breast for the past month. Family history of breast cancer in a paternal grandmother and maternal great aunt. EXAM: DIGITAL DIAGNOSTIC BILATERAL MAMMOGRAM WITH CAD AND TOMO ULTRASOUND LEFT BREAST COMPARISON:  None. ACR Breast Density Category a: The breast tissue is almost entirely fatty. FINDINGS: There are multiple small oil cysts in both breasts. A normal appearing intramammary lymph node is demonstrated in the outer left breast in the region of the focal pain reported by the patient, marked with a metallic marker. There are no mammographic findings suspicious for malignancy in either breast. Mammographic images were processed with CAD. On physical exam, the patient has an approximately 5 cm rounded area of palpable soft tissue protrusion in the inferior axillary region on the left at the location of intermittent pain reported by the patient. She reports that this has been present for many years and fluctuates in size. This is mildly firm to palpation.  Targeted ultrasound is performed, showing normal appearing fatty tissue and underlying lymph nodes in the inferior left axilla at the location of rounded soft tissue protrusion and firmness. No fibroglandular tissue is seen in that area. Also demonstrated is a normal intramammary lymph node in the axillary tail region of the left  breast, seen mammographically. No mass or other findings suspicious for malignancy were seen. IMPRESSION: No evidence of malignancy. The 5 cm rounded area of firm palpable soft tissue protrusion in the inferior left axilla most likely represents a normal axillary fat pad or lipoma. RECOMMENDATION: Annual screening mammography beginning at age 37. I have discussed the findings and recommendations with the patient. Results were also provided in writing at the conclusion of the visit. If applicable, a reminder letter will be sent to the patient regarding the next appointment. BI-RADS CATEGORY  2: Benign. Electronically Signed   By: Claudie Revering M.D.   On: 04/17/2018 12:15   Mm Diag Breast Tomo Bilateral  Result Date: 04/17/2018 CLINICAL DATA:  Intermittent focal pain in the posterior aspect of the outer left breast for the past month. Family history of breast cancer in a paternal grandmother and maternal great aunt. EXAM: DIGITAL DIAGNOSTIC BILATERAL MAMMOGRAM WITH CAD AND TOMO ULTRASOUND LEFT BREAST COMPARISON:  None. ACR Breast Density Category a: The breast tissue is almost entirely fatty. FINDINGS: There are multiple small oil cysts in both breasts. A normal appearing intramammary lymph node is demonstrated in the outer left breast in the region of the focal pain reported by the patient, marked with a metallic marker. There are no mammographic findings suspicious for malignancy in either breast. Mammographic images were processed with CAD. On physical exam, the patient has an approximately 5 cm rounded area of palpable soft tissue protrusion in the inferior axillary region on the left  at the location of intermittent pain reported by the patient. She reports that this has been present for many years and fluctuates in size. This is mildly firm to palpation. Targeted ultrasound is performed, showing normal appearing fatty tissue and underlying lymph nodes in the inferior left axilla at the location of rounded soft tissue protrusion and firmness. No fibroglandular tissue is seen in that area. Also demonstrated is a normal intramammary lymph node in the axillary tail region of the left breast, seen mammographically. No mass or other findings suspicious for malignancy were seen. IMPRESSION: No evidence of malignancy. The 5 cm rounded area of firm palpable soft tissue protrusion in the inferior left axilla most likely represents a normal axillary fat pad or lipoma. RECOMMENDATION: Annual screening mammography beginning at age 31. I have discussed the findings and recommendations with the patient. Results were also provided in writing at the conclusion of the visit. If applicable, a reminder letter will be sent to the patient regarding the next appointment. BI-RADS CATEGORY  2: Benign. Electronically Signed   By: Claudie Revering M.D.   On: 04/17/2018 12:15    Procedures Procedures (including critical care time)  Medications Ordered in UC Medications - No data to display  Initial Impression / Assessment and Plan / UC Course  I have reviewed the triage vital signs and the nursing notes.  Pertinent labs & imaging results that were available during my care of the patient were reviewed by me and considered in my medical decision making (see chart for details).    33 year old female presents with left ankle sprain.  X-ray negative.  Rest, ice, compression, elevation.  Meloxicam as needed.  Work note given.  Final Clinical Impressions(s) / UC Diagnoses   Final diagnoses:  Sprain of left ankle, unspecified ligament, initial encounter     Discharge Instructions     Rest, ice, compression,  elevation.  Medication as needed.  Take care  Dr. Lacinda Axon     ED Prescriptions  Medication Sig Dispense Auth. Provider   meloxicam (MOBIC) 15 MG tablet Take 1 tablet (15 mg total) by mouth daily as needed. 30 tablet Coral Spikes, DO     Controlled Substance Prescriptions [place] Controlled Substance Registry consulted? Not Applicable   Coral Spikes, DO 04/18/18 1029

## 2018-04-18 NOTE — ED Triage Notes (Signed)
Patient in today after injuring her left ankle yesterday (04/17/18). Patient states that she was running from a dog and twisted her ankle.

## 2018-04-20 ENCOUNTER — Ambulatory Visit (INDEPENDENT_AMBULATORY_CARE_PROVIDER_SITE_OTHER): Payer: No Typology Code available for payment source | Admitting: Family Medicine

## 2018-04-20 ENCOUNTER — Encounter: Payer: Self-pay | Admitting: Family Medicine

## 2018-04-20 VITALS — BP 98/66 | HR 80 | Temp 98.0°F | Ht 64.25 in | Wt 195.0 lb

## 2018-04-20 DIAGNOSIS — N760 Acute vaginitis: Secondary | ICD-10-CM

## 2018-04-20 DIAGNOSIS — B9689 Other specified bacterial agents as the cause of diseases classified elsewhere: Secondary | ICD-10-CM | POA: Diagnosis not present

## 2018-04-20 DIAGNOSIS — N898 Other specified noninflammatory disorders of vagina: Secondary | ICD-10-CM

## 2018-04-20 NOTE — Progress Notes (Signed)
   Subjective:    Patient ID: Alexa Rosales, female    DOB: 09/02/1985, 33 y.o.   MRN: [id number]  HPI This is a 33 yo female who presents today with vaginal discharge x 1 week. Some thick discharge with mild odor. Has used Monistat x 1 day with some improvement of symptoms. Has recently used some different sanitary napkins. Was using all cotton but was out and used standard pads.  No urinary symptoms.   Past Medical History:  Diagnosis Date  . History of anemia    Past Surgical History:  Procedure Laterality Date  . CESAREAN SECTION    . HERNIA REPAIR    . WISDOM TOOTH EXTRACTION     Family History  Problem Relation Age of Onset  . Diabetes Mother   . Hypertension Mother   . Sarcoidosis Father   . Asthma Sister   . Colon cancer Maternal Grandmother   . Breast cancer Paternal Grandmother   . Breast cancer Maternal Aunt        great MAT   Social History   Tobacco Use  . Smoking status: Never Smoker  . Smokeless tobacco: Never Used  Substance Use Topics  . Alcohol use: Yes    Comment: occassional  . Drug use: No      Review of Systems Per HPI    Objective:   Physical Exam Vitals signs reviewed.  Constitutional:      Appearance: Normal appearance. She is not ill-appearing.  HENT:     Head: Normocephalic and atraumatic.  Cardiovascular:     Rate and Rhythm: Normal rate.  Pulmonary:     Effort: Pulmonary effort is normal.  Neurological:     Mental Status: She is alert and oriented to person, place, and time.  Psychiatric:        Mood and Affect: Mood normal.        Behavior: Behavior normal.        Thought Content: Thought content normal.        Judgment: Judgment normal.       BP 98/66 (BP Location: Left Arm, Patient Position: Sitting, Cuff Size: Large)   Pulse 80   Temp 98 F (36.7 C) (Oral)   Ht 5' 4.25" (1.632 m)   Wt 195 lb (88.5 kg)   LMP 03/26/2018   SpO2 97%   BMI 33.21 kg/m  Wt Readings from Last 3 Encounters:  04/20/18 195 lb (88.5  kg)  04/18/18 189 lb (85.7 kg)  04/04/18 196 lb 12.8 oz (89.3 kg)       Assessment & Plan:  1. Vaginal discharge - patient self collected wet prep - can continue otc monistat while awaiting wet prep - WET PREP BY MOLECULAR PROBE  Clarene Reamer, FNP-BC  [facility]  04/22/2018 8:13 PM

## 2018-04-20 NOTE — Patient Instructions (Signed)
Good to see you today  Continue Vagisil as needed  I will notify you of wet prep results

## 2018-04-21 LAB — WET PREP BY MOLECULAR PROBE
Candida species: NOT DETECTED
MICRO NUMBER:: 8813
SPECIMEN QUALITY:: ADEQUATE
Trichomonas vaginosis: NOT DETECTED

## 2018-04-22 ENCOUNTER — Encounter: Payer: Self-pay | Admitting: Family Medicine

## 2018-04-24 MED ORDER — METRONIDAZOLE 0.75 % VA GEL: 1.0000 | Freq: Every day | VAGINAL | 0 refills | Status: AC

## 2018-04-24 NOTE — Addendum Note (Signed)
Addended by: Clarene Reamer B on: 04/24/2018 06:34 AM   Modules accepted: Orders

## 2018-05-21 ENCOUNTER — Encounter: Payer: Self-pay | Admitting: Family Medicine

## 2018-05-21 ENCOUNTER — Other Ambulatory Visit: Payer: Self-pay

## 2018-05-21 ENCOUNTER — Ambulatory Visit (INDEPENDENT_AMBULATORY_CARE_PROVIDER_SITE_OTHER): Payer: No Typology Code available for payment source | Admitting: Family Medicine

## 2018-05-21 VITALS — BP 112/74 | HR 87 | Temp 98.2°F | Ht 64.25 in | Wt 198.8 lb

## 2018-05-21 DIAGNOSIS — R6889 Other general symptoms and signs: Secondary | ICD-10-CM

## 2018-05-21 DIAGNOSIS — J069 Acute upper respiratory infection, unspecified: Secondary | ICD-10-CM

## 2018-05-21 DIAGNOSIS — B9789 Other viral agents as the cause of diseases classified elsewhere: Secondary | ICD-10-CM | POA: Diagnosis not present

## 2018-05-21 DIAGNOSIS — N926 Irregular menstruation, unspecified: Secondary | ICD-10-CM | POA: Diagnosis not present

## 2018-05-21 LAB — POC INFLUENZA A&B (BINAX/QUICKVUE)
INFLUENZA A, POC: NEGATIVE
Influenza B, POC: NEGATIVE

## 2018-05-21 LAB — HCG, QUANTITATIVE, PREGNANCY: Quantitative HCG: <0.6 m[IU]/mL

## 2018-05-21 MED ORDER — NORETHIN ACE-ETH ESTRAD-FE 1-20 MG-MCG PO TABS: 1.0000 | ORAL_TABLET | Freq: Every day | ORAL | 4 refills | Status: DC

## 2018-05-21 NOTE — Progress Notes (Addendum)
Subjective:    Patient ID: Alexa Rosales, female    DOB: April 26, 1985, 33 y.o.   MRN: [id number]  HPI  This is a pleasant 33 yo female presenting to the office with flu-like symptoms, body aches, chills, congestion with green nasal mucus. No complaints of fever. One occurrence of nausea the evening of 2/2. Symptoms began last Wednesday at her new job training. She was around another ill-individual who stated only having  "sinus" problems. Patient also stated that she had no period last month (LMP: 03/26/2018) and concerned for pregnancy even though 4 home pregnancy tests were negative. Past Medical History:  Diagnosis Date  . History of anemia    Past Surgical History:  Procedure Laterality Date  . CESAREAN SECTION    . HERNIA REPAIR    . WISDOM TOOTH EXTRACTION     Family History  Problem Relation Age of Onset  . Diabetes Mother   . Hypertension Mother   . Sarcoidosis Father   . Asthma Sister   . Colon cancer Maternal Grandmother   . Breast cancer Paternal Grandmother   . Breast cancer Maternal Aunt        great MAT   Social History   Tobacco Use  . Smoking status: Never Smoker  . Smokeless tobacco: Never Used  Substance Use Topics  . Alcohol use: Yes    Comment: occassional  . Drug use: No      Review of Systems  Constitutional: Negative.   HENT: Positive for congestion, postnasal drip, sinus pressure, sinus pain and sore throat.   Respiratory: Positive for cough and chest tightness. Negative for shortness of breath, wheezing and stridor.   Cardiovascular: Negative.   Gastrointestinal: Positive for nausea. Negative for vomiting.       Mild nausea last evening  Genitourinary: Negative.   Musculoskeletal: Negative.   Skin: Negative.   Neurological: Negative.       Objective:   Physical Exam Vitals signs reviewed. Exam conducted with a chaperone present.  Constitutional:      Appearance: Normal appearance.  HENT:     Head: Normocephalic.     Nose: Congestion  present.     Mouth/Throat:     Mouth: Mucous membranes are moist.     Pharynx: Posterior oropharyngeal erythema present. No oropharyngeal exudate.  Eyes:     Extraocular Movements: Extraocular movements intact.     Conjunctiva/sclera: Conjunctivae normal.     Pupils: Pupils are equal, round, and reactive to light.  Neck:     Musculoskeletal: Normal range of motion and neck supple.  Cardiovascular:     Rate and Rhythm: Normal rate and regular rhythm.     Heart sounds: Normal heart sounds.  Pulmonary:     Effort: Pulmonary effort is normal.     Breath sounds: Normal breath sounds.  Skin:    General: Skin is warm and dry.  Neurological:     Mental Status: She is alert and oriented to person, place, and time.  Psychiatric:        Mood and Affect: Mood normal.        Behavior: Behavior normal.    BP 112/74   Pulse 87   Temp 98.2 F (36.8 C) (Oral)   Ht 5' 4.25" (1.632 m)   Wt 198 lb 12.8 oz (90.2 kg)   SpO2 97%   BMI 33.86 kg/m  Filed Weights   05/21/18 1025  Weight: 198 lb 12.8 oz (90.2 kg)  Assessment & Plan:  I feel you have an URI. We will check for flu. Continue to treat symptoms with Flonase, Sudafed, and steam (neti pot). We will test blood for pregnancy, if negative, we will start you on oral contraceptives Get plenty of rest. Continue to hydrate with plenty of water to turn urine very light yellow in color. Symptoms should improve within a week, cough can last up to 3-4 weeks. If fever starts and persists (>/= 101 degrees) please call office for appointment. Note generated for work absence.

## 2018-05-21 NOTE — Patient Instructions (Signed)
Good to see you today  Add sudafed, 1 tablet twice a day (am and after lunch) for nasal congestion Can add Mucinex to thin secretions Drink enough fluids to make your urine light yellow

## 2018-05-21 NOTE — Addendum Note (Signed)
Addended by: Clarene Reamer B on: 05/21/2018 04:46 PM   Modules accepted: Orders

## 2018-05-21 NOTE — Progress Notes (Signed)
Subjective:    Patient ID: Alexa Rosales, female    DOB: 09-Aug-1985, 33 y.o.   MRN: [id number]  HPI This is a 33 yo female who presents today with flu like symptoms x 6 days, congestion, green nasal drainage, no fever, body aches. Has had theraflu, flonase, cetirizine- some relief, neti pot. Cough dry. No wheeze or SOB. Mild nausea yesterday. Sat next to a sick coworker. No one at home sick. Vaccinated against flu in fall.   She requests pregnancy testing due to late menses. Requests blood testing, has had several negative home pregnancy tests. LMP 03/26/18. No current birth control. Was on patch but was concerned for side effects due to her weight. No history clotting disorders, no problems on OCP in past. Has 5 children, aged 50-16. Husband supportive of possible pregnancy. No breast tenderness. Mild nausea yesterday only.   Past Medical History:  Diagnosis Date  . History of anemia    Past Surgical History:  Procedure Laterality Date  . CESAREAN SECTION    . HERNIA REPAIR    . WISDOM TOOTH EXTRACTION     Family History  Problem Relation Age of Onset  . Diabetes Mother   . Hypertension Mother   . Sarcoidosis Father   . Asthma Sister   . Colon cancer Maternal Grandmother   . Breast cancer Paternal Grandmother   . Breast cancer Maternal Aunt        great MAT   Social History   Tobacco Use  . Smoking status: Never Smoker  . Smokeless tobacco: Never Used  Substance Use Topics  . Alcohol use: Yes    Comment: occassional  . Drug use: No    Review of Systems Per HPI    Objective:   Physical Exam Vitals signs reviewed.  Constitutional:      General: She is not in acute distress.    Appearance: Normal appearance. She is obese. She is not ill-appearing or toxic-appearing.  HENT:     Head: Normocephalic and atraumatic.     Right Ear: Tympanic membrane, ear canal and external ear normal.     Left Ear: Tympanic membrane, ear canal and external ear normal.     Nose:  Congestion present.     Mouth/Throat:     Mouth: Mucous membranes are moist.     Pharynx: Oropharynx is clear. No oropharyngeal exudate or posterior oropharyngeal erythema.  Eyes:     Conjunctiva/sclera: Conjunctivae normal.  Neck:     Musculoskeletal: Normal range of motion. No neck rigidity or muscular tenderness.  Cardiovascular:     Rate and Rhythm: Normal rate and regular rhythm.     Heart sounds: Normal heart sounds.  Pulmonary:     Effort: Pulmonary effort is normal.     Breath sounds: Normal breath sounds.  Lymphadenopathy:     Cervical: No cervical adenopathy.  Skin:    General: Skin is warm and dry.  Neurological:     Mental Status: She is alert and oriented to person, place, and time.  Psychiatric:        Mood and Affect: Mood normal.        Behavior: Behavior normal.        Thought Content: Thought content normal.        Judgment: Judgment normal.       BP 112/74   Pulse 87   Temp 98.2 F (36.8 C) (Oral)   Ht 5' 4.25" (1.632 m)   Wt 198 lb 12.8 oz (90.2 kg)  SpO2 97%   BMI 33.86 kg/m  Wt Readings from Last 3 Encounters:  05/21/18 198 lb 12.8 oz (90.2 kg)  04/20/18 195 lb (88.5 kg)  04/18/18 189 lb (85.7 kg)   Results for orders placed or performed in visit on 05/21/18  POC Influenza A&B(BINAX/QUICKVUE)  Result Value Ref Range   Influenza A, POC Negative Negative   Influenza B, POC Negative Negative        Assessment & Plan:  1. Flu-like symptoms - negative flu test - POC Influenza A&B(BINAX/QUICKVUE)  2. Viral URI with cough - Provided written and verbal information regarding diagnosis and treatment. - symptomatic treatment, fluids, rest  3. Missed period - hCG, quantitative, pregnancy   Clarene Reamer, FNP-BC  [facility], [place]  05/21/2018 10:49 AM

## 2018-05-21 NOTE — Progress Notes (Signed)
Patient requested revised work note.

## 2018-07-13 ENCOUNTER — Other Ambulatory Visit: Payer: Self-pay

## 2018-07-13 ENCOUNTER — Encounter: Payer: Self-pay | Admitting: Family Medicine

## 2018-07-13 ENCOUNTER — Ambulatory Visit: Payer: BLUE CROSS/BLUE SHIELD | Admitting: Family Medicine

## 2018-07-13 VITALS — BP 120/68 | HR 76 | Temp 98.2°F | Ht 64.25 in | Wt 199.4 lb

## 2018-07-13 DIAGNOSIS — R790 Abnormal level of blood mineral: Secondary | ICD-10-CM

## 2018-07-13 DIAGNOSIS — E559 Vitamin D deficiency, unspecified: Secondary | ICD-10-CM

## 2018-07-13 DIAGNOSIS — Z113 Encounter for screening for infections with a predominantly sexual mode of transmission: Secondary | ICD-10-CM | POA: Diagnosis not present

## 2018-07-13 DIAGNOSIS — N760 Acute vaginitis: Secondary | ICD-10-CM

## 2018-07-13 DIAGNOSIS — N898 Other specified noninflammatory disorders of vagina: Secondary | ICD-10-CM

## 2018-07-13 DIAGNOSIS — B9689 Other specified bacterial agents as the cause of diseases classified elsewhere: Secondary | ICD-10-CM

## 2018-07-13 LAB — FERRITIN: Ferritin: 8.6 ng/mL — ABNORMAL LOW (ref 10.0–291.0)

## 2018-07-13 LAB — VITAMIN D 25 HYDROXY (VIT D DEFICIENCY, FRACTURES): VITD: 54.81 ng/mL (ref 30.00–100.00)

## 2018-07-13 NOTE — Patient Instructions (Signed)
Good to see you today  I will notify you of lab results as soon as they are available

## 2018-07-13 NOTE — Progress Notes (Signed)
Subjective:    Patient ID: Alexa Rosales, female    DOB: 11-11-1985, 33 y.o.   MRN: [id number]  HPI This is a 33 yo female who presents today with vaginal discharge x 3 weeks. Intermittent, some yellow, occasional odor. Did two days of metronidazole vaginal gel.  She had an unprotected episode of intercourse about 6 weeks. Requests STI testing. No symptoms other than vaginal discharge. No fever/chills, abdominal pain.  She and her husband are considering separation. They have tried counseling, but she feels that their relationship is too far gone.  Last pap 11/2016- normal  Past Medical History:  Diagnosis Date  . History of anemia    Past Surgical History:  Procedure Laterality Date  . CESAREAN SECTION    . HERNIA REPAIR    . WISDOM TOOTH EXTRACTION     Family History  Problem Relation Age of Onset  . Diabetes Mother   . Hypertension Mother   . Sarcoidosis Father   . Asthma Sister   . Colon cancer Maternal Grandmother   . Breast cancer Paternal Grandmother   . Breast cancer Maternal Aunt        great MAT   Social History   Tobacco Use  . Smoking status: Never Smoker  . Smokeless tobacco: Never Used  Substance Use Topics  . Alcohol use: Yes    Comment: occassional  . Drug use: No      Review of Systems Per HPI    Objective:   Physical Exam Vitals signs reviewed.  Constitutional:      General: She is not in acute distress.    Appearance: Normal appearance. She is obese. She is not ill-appearing, toxic-appearing or diaphoretic.  Eyes:     Conjunctiva/sclera: Conjunctivae normal.  Cardiovascular:     Rate and Rhythm: Normal rate.  Pulmonary:     Effort: Pulmonary effort is normal.  Genitourinary:    General: Normal vulva.     Exam position: Supine.     Pubic Area: No rash.      Labia:        Right: No rash, tenderness, lesion or injury.        Left: No rash, tenderness, lesion or injury.      Vagina: Vaginal discharge (thick, white) present.  Skin:   General: Skin is warm and dry.  Neurological:     Mental Status: She is alert and oriented to person, place, and time.  Psychiatric:        Mood and Affect: Mood normal.        Behavior: Behavior normal.        Thought Content: Thought content normal.        Judgment: Judgment normal.       BP 120/68   Pulse 76   Temp 98.2 F (36.8 C)   Ht 5' 4.25" (1.632 m)   Wt 199 lb 6.4 oz (90.4 kg)   SpO2 97%   BMI 33.96 kg/m  Wt Readings from Last 3 Encounters:  07/13/18 199 lb 6.4 oz (90.4 kg)  05/21/18 198 lb 12.8 oz (90.2 kg)  04/20/18 195 lb (88.5 kg)       Assessment & Plan:  1. Routine screening for STI (sexually transmitted infection) - C. trachomatis/N. gonorrhoeae RNA - HIV Antibody (routine testing w rflx) - RPR - Trichomonas vaginalis RNA, Ql,Males  2. Vaginal discharge - WET PREP BY MOLECULAR PROBE - Trichomonas vaginalis RNA, Ql,Males  3. Vitamin D deficiency - Vitamin D, 25-hydroxy  4.  Low ferritin - Ferritin   Clarene Reamer, FNP-BC  [facility]  07/13/2018 11:27 AM

## 2018-07-17 ENCOUNTER — Encounter: Payer: Self-pay | Admitting: Family Medicine

## 2018-07-17 LAB — WET PREP BY MOLECULAR PROBE
Candida species: NOT DETECTED
MICRO NUMBER:: 359464
SPECIMEN QUALITY:: ADEQUATE
Trichomonas vaginosis: NOT DETECTED

## 2018-07-17 LAB — HIV ANTIBODY (ROUTINE TESTING W REFLEX): HIV 1&2 Ab, 4th Generation: NONREACTIVE

## 2018-07-17 LAB — RPR: RPR: NONREACTIVE

## 2018-07-17 LAB — C. TRACHOMATIS/N. GONORRHOEAE RNA

## 2018-07-17 LAB — TRICHOMONAS VAGINALIS, PROBE AMP

## 2018-07-17 MED ORDER — METRONIDAZOLE 500 MG PO TABS: 500.0000 mg | ORAL_TABLET | Freq: Two times a day (BID) | ORAL | 0 refills | Status: AC

## 2018-07-17 NOTE — Addendum Note (Signed)
Addended by: Clarene Reamer B on: 07/17/2018 03:48 PM   Modules accepted: Orders

## 2018-07-18 ENCOUNTER — Other Ambulatory Visit: Payer: Self-pay | Admitting: Family Medicine

## 2018-07-18 ENCOUNTER — Other Ambulatory Visit: Payer: BLUE CROSS/BLUE SHIELD

## 2018-07-18 ENCOUNTER — Other Ambulatory Visit: Payer: Self-pay

## 2018-07-18 DIAGNOSIS — A749 Chlamydial infection, unspecified: Secondary | ICD-10-CM

## 2018-07-18 DIAGNOSIS — Z113 Encounter for screening for infections with a predominantly sexual mode of transmission: Secondary | ICD-10-CM

## 2018-07-18 NOTE — Telephone Encounter (Signed)
Copied from [place] [id number]. Topic: Appointment Scheduling - Scheduling Inquiry for Clinic >> Jul 17, 2018  4:08 PM Alanda Slim E wrote: Reason for CRM: Pt was advised to come in to redo a urine culture / please advise

## 2018-07-18 NOTE — Telephone Encounter (Signed)
Alexa Rosales,   Can you call and schedule lab only appointment for urine.  Orders are in [place].

## 2018-07-20 ENCOUNTER — Encounter: Payer: Self-pay | Admitting: Family Medicine

## 2018-07-20 LAB — C. TRACHOMATIS/N. GONORRHOEAE RNA
C. trachomatis RNA, TMA: DETECTED — AB
N. gonorrhoeae RNA, TMA: NOT DETECTED

## 2018-07-20 MED ORDER — AZITHROMYCIN 500 MG PO TABS: 1000.0000 mg | ORAL_TABLET | Freq: Once | ORAL | 0 refills | Status: AC

## 2018-07-20 NOTE — Addendum Note (Signed)
Addended by: Clarene Reamer B on: 07/20/2018 11:25 AM   Modules accepted: Orders

## 2018-07-23 ENCOUNTER — Ambulatory Visit: Payer: BLUE CROSS/BLUE SHIELD | Admitting: Allergy

## 2018-07-23 ENCOUNTER — Other Ambulatory Visit: Payer: Self-pay

## 2018-07-23 ENCOUNTER — Encounter: Payer: Self-pay | Admitting: Allergy

## 2018-07-23 VITALS — BP 110/70 | HR 82 | Temp 97.7°F | Resp 16 | Ht 64.25 in | Wt 199.6 lb

## 2018-07-23 DIAGNOSIS — J310 Chronic rhinitis: Secondary | ICD-10-CM

## 2018-07-23 MED ORDER — FLUTICASONE PROPIONATE 93 MCG/ACT NA EXHU: 2.0000 | INHALANT_SUSPENSION | Freq: Two times a day (BID) | NASAL | 5 refills | Status: DC

## 2018-07-23 NOTE — Assessment & Plan Note (Addendum)
Perennial rhinitis symptoms for 10 years only during the spring but worsening the past 7 months with nasal discharge. Used Flonase prn, zyrtec D and nettipot with some benefit. No previous ENT evaluation.  Today's skin testing was negative to environmental allergies.  Offered intradermal testing but patient prefers immunocap.  Get bloodwork as below and will make additional recommendations based on results.   If negative, will recommend ENT referral to rule out any anatomical issues.  May also do a trial of antibiotics and prednisone in case there is a sinus infection present.    Start saline irrigations twice a day if needed.  Start Xhance 2 sprays twice a day. Demonstrated proper use. This replaced Flonase.

## 2018-07-23 NOTE — Patient Instructions (Addendum)
Today's skin testing was negative to environmental allergies  Check IgE with zone 2.  Recommend ENT referral.  Start saline irrigations twice a day if needed. Start Xhance 2 sprays twice a day.   Follow up in 3 months   Buffered Isotonic Saline Irrigations:  Goal: . When you irrigate with the isotonic saline (salt water) it washes mucous and other debris from your nose that could be contributing to your nasal symptoms.   Recipe: Marland Kitchen Obtain 1 quart jar that is clean . Fill with clean (bottled, boiled or distilled) water . Add 1-2 heaping teaspoons of salt without iodine o If the solution with 2 teaspoons of salt is too strong, adjust the amount down until better tolerated . Add 1 teaspoon of [employer] baking soda (pure bicarbonate) . Mix ingredients together and store at room temperature and discard after 1 week * Alternatively you can buy pre made salt packets for the NeilMed bottle or there          are other over the counter brands available  Instructions: . Warm  cup of the solution in the microwave if desired but be careful not to overheat as this will burn the inside of your nose . Stand over a sink (or do it while you shower) and squirt the solution into one side of your nose aiming towards the back of your head o Sometimes saying "k k k k k" while irrigating can be helpful to prevent fluid from going down your throat  . The solution will travel to the back of your nose and then come out the other side . Perform this again on the other side . Try to do this twice a day . If you are using a nasal spray in addition to the irrigation, irrigate first and then use the topical nasal spray otherwise you will wash the nasal spray out of your nose

## 2018-07-23 NOTE — Progress Notes (Signed)
New Patient Note  RE: Alexa Rosales MRN: [id number] DOB: [birth year] Date of Office Visit: 07/23/2018  Referring provider: No ref. provider found Primary care provider: Elby Beck, FNP  Chief Complaint: Nasal Congestion (lots of nasal swelling. using Flonase prn with no relief. ZyrtecD and this has helped some but not much. constant post nasal darainage and nasal discharge that are both green. )  History of Present Illness: I had the pleasure of seeing Alexa Rosales for initial evaluation at the [facility] on 07/23/2018. She is a 33 y.o. female, who is self-referred here for the evaluation of nasal congestion.  Rhinitis: She reports symptoms of nasal congestion with swelling, nasal discharge, snoring, PND, rhinorrhea, sneezing. Symptoms have been going on for 10 years but worsening the past 7 months with the discharge. The symptoms are present during the spring. Anosmia: diminished sense of smell. Headache: yes. She has used Flonase prn, zyrtec D, nettipot with some improvement in symptoms. Sinus infections: twice. Previous work up includes: none. Patient finished azithromycin for a different infection with no benefit in her sinus symptoms.  Previous ENT evaluation: no. Previous sinus imaging: no.  Assessment and Plan: Alexa Rosales is a 33 y.o. female with: Chronic rhinitis Perennial rhinitis symptoms for 10 years only during the spring but worsening the past 7 months with nasal discharge. Used Flonase prn, zyrtec D and nettipot with some benefit. No previous ENT evaluation.  Today's skin testing was negative to environmental allergies.  Offered intradermal testing but patient prefers immunocap.  Get bloodwork as below and will make additional recommendations based on results.   If negative, will recommend ENT referral to rule out any anatomical issues.  May also do a trial of antibiotics and prednisone in case there is a sinus infection present.    Start  saline irrigations twice a day if needed.  Start Xhance 2 sprays twice a day. Demonstrated proper use. This replaced Flonase.   Return in about 3 months (around 10/22/2018).  Meds ordered this encounter  Medications  . Fluticasone Propionate (XHANCE) 93 MCG/ACT EXHU    Sig: Place 2 sprays into the nose 2 (two) times daily.    Dispense:  16 mL    Refill:  5    Home Phone: [phone] (Preferred)    Lab Orders     Allergens w/Total IgE Area 2  Other allergy screening: Asthma: no Rhino conjunctivitis: yes Food allergy: no Medication allergy: yes  Sulfa - not sure what happened - as a child Hymenoptera allergy: no Urticaria: no Eczema:no History of recurrent infections suggestive of immunodeficency: no  Diagnostics: Skin Testing: Environmental allergy panel. Negative test to: environmental allergy panel.  Results discussed with patient/family. Airborne Adult Perc - 07/23/18 0959    Time Antigen Placed  1005    Allergen Manufacturer  Lavella Hammock    Location  Back    Number of Test  59    Panel 1  Select    1. Control-Buffer 50% Glycerol  Negative    2. Control-Histamine 1 mg/ml  3+    3. Albumin saline  Negative    4. Folsom  Negative    5. [place]  Negative    6. Johnson  Negative    7. Dudley Blue  Negative    8. Meadow Fescue  Negative    9. Perennial Rye  Negative    10. Sweet Vernal  Negative    11. Timothy  Negative    12. Cocklebur  Negative  13. Burweed Marshelder  Negative    14. Ragweed, short  Negative    15. Ragweed, Giant  Negative    16. Plantain,  English  Negative    17. Lamb's Quarters  Negative    18. Sheep Sorrell  Negative    19. Rough Pigweed  Negative    20. Marsh Elder, Rough  Negative    21. Mugwort, Common  Negative    22. Ash mix  Negative    23. Birch mix  Negative    24. Beech American  Negative    25. Box, Elder  Negative    26. Cedar, red  Negative    27. Cottonwood, [place]  Negative    28. Elm mix  Negative    29. Hickory mix   Negative    30. Maple mix  Negative    31. Oak, [place] mix  Negative    32. Pecan Pollen  Negative    33. Pine mix  Negative    34. Sycamore Eastern  Negative    35. Dooms, Black Pollen  Negative    36. Alternaria alternata  Negative    37. Cladosporium Herbarum  Negative    38. Aspergillus mix  Negative    39. Penicillium mix  Negative    40. Bipolaris sorokiniana (Helminthosporium)  Negative    41. Drechslera spicifera (Curvularia)  Negative    42. Mucor plumbeus  Negative    43. Fusarium moniliforme  Negative    44. Aureobasidium pullulans (pullulara)  Negative    45. Rhizopus oryzae  Negative    46. Botrytis cinera  Negative    47. Epicoccum nigrum  Negative    48. Phoma betae  Negative    49. Candida Albicans  Negative    50. Trichophyton mentagrophytes  Negative    51. Mite, D Farinae  5,000 AU/ml  Negative    52. Mite, D Pteronyssinus  5,000 AU/ml  Negative    53. Cat Hair 10,000 BAU/ml  Negative    54.  Dog Epithelia  Negative    55. Mixed Feathers  Negative    56. Horse Epithelia  Negative    57. Cockroach, German  Negative    58. Mouse  Negative    59. Tobacco Leaf  Negative       Past Medical History: Patient Active Problem List   Diagnosis Date Noted  . Chronic rhinitis 07/23/2018  . Abnormal uterine bleeding (AUB) 12/29/2015  . Family history of breast cancer in female 11/15/2011  . Allergic rhinitis 08/25/2011  . Genital herpes 08/25/2011   Past Medical History:  Diagnosis Date  . History of anemia    Past Surgical History: Past Surgical History:  Procedure Laterality Date  . CESAREAN SECTION    . HERNIA REPAIR    . WISDOM TOOTH EXTRACTION     Medication List:  Current Outpatient Medications  Medication Sig Dispense Refill  . cetirizine (ZYRTEC) 5 MG tablet Take 5 mg by mouth daily.    . ferrous sulfate 325 (65 FE) MG tablet Take 325 mg by mouth daily with breakfast.    . metroNIDAZOLE (FLAGYL) 500 MG tablet Take 1 tablet (500 mg total) by  mouth 2 (two) times daily for 7 days. 14 tablet 0  . NON FORMULARY Vita fusion    . norethindrone-ethinyl estradiol (JUNEL FE,GILDESS FE,LOESTRIN FE) 1-20 MG-MCG tablet Take 1 tablet by mouth daily. 3 Package 4  . Probiotic Product (PROBIOTIC PO) Take by mouth.    Marland Kitchen  Fluticasone Propionate (XHANCE) 93 MCG/ACT EXHU Place 2 sprays into the nose 2 (two) times daily. 16 mL 5   No current facility-administered medications for this visit.    Allergies: Allergies  Allergen Reactions  . Sulfa Antibiotics    Social History: Social History   Socioeconomic History  . Marital status: Married    Spouse name: Not on file  . Number of children: Not on file  . Years of education: Not on file  . Highest education level: Not on file  Occupational History  . Not on file  Social Needs  . Financial resource strain: Not on file  . Food insecurity:    Worry: Not on file    Inability: Not on file  . Transportation needs:    Medical: Not on file    Non-medical: Not on file  Tobacco Use  . Smoking status: Never Smoker  . Smokeless tobacco: Never Used  Substance and Sexual Activity  . Alcohol use: Yes    Comment: occassional  . Drug use: No  . Sexual activity: Yes  Lifestyle  . Physical activity:    Days per week: Not on file    Minutes per session: Not on file  . Stress: Not on file  Relationships  . Social connections:    Talks on phone: Not on file    Gets together: Not on file    Attends religious service: Not on file    Active member of club or organization: Not on file    Attends meetings of clubs or organizations: Not on file    Relationship status: Not on file  Other Topics Concern  . Not on file  Social History Narrative  . Not on file   Lives in a 34 year old home. Smoking: no Occupation: Therapist, sports HistoryFreight forwarder in the house: no Charity fundraiser in the family room: yes Carpet in the bedroom: yes Heating: electric Cooling: central Pet: no but used to have  kittens about 1 year ago  Family History: Family History  Problem Relation Age of Onset  . Diabetes Mother   . Hypertension Mother   . Sarcoidosis Father   . Asthma Sister   . Colon cancer Maternal Grandmother   . Breast cancer Paternal Grandmother   . Breast cancer Maternal Aunt        great MAT   Problem                               Relation Allergic rhino conjunctivitis     children  Review of Systems  Constitutional: Negative for appetite change, chills, fever and unexpected weight change.  HENT: Positive for congestion, postnasal drip, rhinorrhea and sneezing.   Eyes: Negative for itching.  Respiratory: Negative for cough, chest tightness, shortness of breath and wheezing.   Cardiovascular: Negative for chest pain.  Gastrointestinal: Negative for abdominal pain.  Genitourinary: Negative for difficulty urinating.  Skin: Negative for rash.  Neurological: Positive for headaches.   Objective: BP 110/70 (BP Location: Left Arm, Patient Position: Sitting, Cuff Size: Normal)   Pulse 82   Temp 97.7 F (36.5 C) (Oral)   Resp 16   Ht 5' 4.25" (1.632 m)   Wt 199 lb 9.6 oz (90.5 kg)   SpO2 98%   BMI 34.00 kg/m  Body mass index is 34 kg/m. Physical Exam  Constitutional: She is oriented to person, place, and time. She appears well-developed and well-nourished.  HENT:  Head: Normocephalic and atraumatic.  Right Ear: External ear normal.  Left Ear: External ear normal.  Nose: Nose normal.  Mouth/Throat: Oropharynx is clear and moist.  Eyes: Conjunctivae and EOM are normal.  Neck: Neck supple.  Cardiovascular: Normal rate, regular rhythm and normal heart sounds. Exam reveals no gallop and no friction rub.  No murmur heard. Pulmonary/Chest: Effort normal and breath sounds normal. She has no wheezes. She has no rales.  Abdominal: Soft. Bowel sounds are normal. There is no abdominal tenderness.  Lymphadenopathy:    She has no cervical adenopathy.  Neurological: She is alert  and oriented to person, place, and time.  Skin: Skin is warm. No rash noted.  Psychiatric: She has a normal mood and affect. Her behavior is normal.  Nursing note and vitals reviewed.  The plan was reviewed with the patient/family, and all questions/concerned were addressed.  It was my pleasure to see Alexa Rosales today and participate in her care. Please feel free to contact me with any questions or concerns.  Sincerely,  Rexene Alberts, DO Allergy & Immunology  [facility] office: [phone] [facility] office: [phone]

## 2018-07-25 ENCOUNTER — Encounter: Payer: Self-pay | Admitting: Allergy

## 2018-07-25 LAB — ALLERGENS W/TOTAL IGE AREA 2

## 2018-07-26 ENCOUNTER — Telehealth: Payer: Self-pay

## 2018-07-26 NOTE — Telephone Encounter (Signed)
-----   Message from [place], [place] sent at 07/25/2018  2:48 PM EDT ----- Regarding: FW: ENT referral  ----- Message ----- From: Garnet Sierras, DO Sent: 07/25/2018   1:40 PM EDT To: [facility] Subject: ENT referral                                   Please place referral to ENT for chronic rhinitis, negative allergy testing results. Thank you.

## 2018-07-26 NOTE — Telephone Encounter (Signed)
Referral placed to Dr Benjamine Mola. I have sent a message to the patient via [place].  Thanks

## 2018-10-03 ENCOUNTER — Telehealth: Payer: Self-pay

## 2018-10-03 NOTE — Telephone Encounter (Signed)
Copied from [place] [phone]. Topic: Appointment Scheduling - Scheduling Inquiry for Clinic >> Oct 02, 2018  4:17 PM Rainey Pines A wrote: Patient would like to schedule an appointment in regards to symptoms associated with the STD she has.

## 2018-10-03 NOTE — Telephone Encounter (Signed)
Pt scheduled for 10/05/18 @ 10:15am.

## 2018-10-05 ENCOUNTER — Encounter: Payer: Self-pay | Admitting: Family Medicine

## 2018-10-05 ENCOUNTER — Ambulatory Visit: Payer: BLUE CROSS/BLUE SHIELD | Admitting: Family Medicine

## 2018-10-05 VITALS — BP 128/72 | HR 90 | Temp 97.2°F | Ht 64.25 in | Wt 199.5 lb

## 2018-10-05 DIAGNOSIS — N898 Other specified noninflammatory disorders of vagina: Secondary | ICD-10-CM | POA: Diagnosis not present

## 2018-10-05 DIAGNOSIS — Z113 Encounter for screening for infections with a predominantly sexual mode of transmission: Secondary | ICD-10-CM | POA: Diagnosis not present

## 2018-10-05 DIAGNOSIS — Z8619 Personal history of other infectious and parasitic diseases: Secondary | ICD-10-CM | POA: Diagnosis not present

## 2018-10-05 NOTE — Patient Instructions (Signed)
Good to see you today  I'll notify you of results when I get them

## 2018-10-05 NOTE — Progress Notes (Signed)
   Subjective:    Patient ID: Alexa Rosales, female    DOB: [birth year], 33 y.o.   MRN: [id number]  HPI This is a 33 yo female who presents today with vaginal discharge. She was seen 07/13/2018 and diagnosed with chlamydia. She completed treatment. She abstained from sexual intercourse longer than advised. She resumed relations in the last couple of weeks. About 10 days ago she noticed a thin, slightly malodorous, itchy discharge. No dysuria, no fever/chills, no abdominal pain.   Past Medical History:  Diagnosis Date  . History of anemia    Past Surgical History:  Procedure Laterality Date  . CESAREAN SECTION    . HERNIA REPAIR    . WISDOM TOOTH EXTRACTION     Family History  Problem Relation Age of Onset  . Diabetes Mother   . Hypertension Mother   . Sarcoidosis Father   . Asthma Sister   . Colon cancer Maternal Grandmother   . Breast cancer Paternal Grandmother   . Breast cancer Maternal Aunt        great MAT   Social History   Tobacco Use  . Smoking status: Never Smoker  . Smokeless tobacco: Never Used  Substance Use Topics  . Alcohol use: Yes    Comment: occassional  . Drug use: No      Review of Systems Per HPI    Objective:   Physical Exam Vitals signs reviewed. Exam conducted with a chaperone present.  Constitutional:      General: She is not in acute distress.    Appearance: Normal appearance. She is obese. She is not ill-appearing, toxic-appearing or diaphoretic.  HENT:     Head: Normocephalic and atraumatic.  Cardiovascular:     Rate and Rhythm: Normal rate.  Pulmonary:     Effort: Pulmonary effort is normal.  Genitourinary:    Exam position: Supine.     Pubic Area: No rash.      Comments: Moderate amount watery, white/grey discharge. Wet prep and GC/Chlamydia swabs obtained.  Neurological:     Mental Status: She is alert.       BP 128/72 (BP Location: Left Arm, Patient Position: Sitting, Cuff Size: Normal)   Pulse 90   Temp (!) 97.2 F  (36.2 C) (Temporal)   Ht 5' 4.25" (1.632 m)   Wt 199 lb 8 oz (90.5 kg)   LMP  (LMP Unknown)   SpO2 97%   BMI 33.98 kg/m  Wt Readings from Last 3 Encounters:  10/05/18 199 lb 8 oz (90.5 kg)  07/23/18 199 lb 9.6 oz (90.5 kg)  07/13/18 199 lb 6.4 oz (90.4 kg)       Assessment & Plan:  1. Vaginal discharge - RPR - HIV Antibody (routine testing w rflx) - C. trachomatis/N. gonorrhoeae RNA - WET PREP BY MOLECULAR PROBE  2. History of chlamydia - RPR - HIV Antibody (routine testing w rflx) - C. trachomatis/N. gonorrhoeae RNA   Clarene Reamer, FNP-BC  [facility]  10/05/2018 10:27 AM

## 2018-10-06 LAB — C. TRACHOMATIS/N. GONORRHOEAE RNA
C. trachomatis RNA, TMA: NOT DETECTED
N. gonorrhoeae RNA, TMA: NOT DETECTED

## 2018-10-06 LAB — WET PREP BY MOLECULAR PROBE
Candida species: NOT DETECTED
MICRO NUMBER:: 588518
SPECIMEN QUALITY:: ADEQUATE
Trichomonas vaginosis: NOT DETECTED

## 2018-10-07 ENCOUNTER — Encounter: Payer: Self-pay | Admitting: Family Medicine

## 2018-10-08 ENCOUNTER — Other Ambulatory Visit: Payer: Self-pay | Admitting: Family Medicine

## 2018-10-08 DIAGNOSIS — B9689 Other specified bacterial agents as the cause of diseases classified elsewhere: Secondary | ICD-10-CM

## 2018-10-08 DIAGNOSIS — N760 Acute vaginitis: Secondary | ICD-10-CM

## 2018-10-08 LAB — HIV ANTIBODY (ROUTINE TESTING W REFLEX): HIV 1&2 Ab, 4th Generation: NONREACTIVE

## 2018-10-08 LAB — RPR: RPR Ser Ql: NONREACTIVE

## 2018-10-08 MED ORDER — METRONIDAZOLE 500 MG PO TABS: 500.0000 mg | ORAL_TABLET | Freq: Two times a day (BID) | ORAL | 0 refills | Status: AC

## 2018-10-08 NOTE — Progress Notes (Signed)
mero

## 2018-11-12 ENCOUNTER — Encounter: Payer: Self-pay | Admitting: Family Medicine

## 2018-12-03 ENCOUNTER — Encounter: Payer: Self-pay | Admitting: Family Medicine

## 2018-12-07 ENCOUNTER — Encounter: Payer: Self-pay | Admitting: Family Medicine

## 2018-12-07 ENCOUNTER — Other Ambulatory Visit: Payer: Self-pay

## 2018-12-07 ENCOUNTER — Ambulatory Visit: Payer: BLUE CROSS/BLUE SHIELD | Admitting: Family Medicine

## 2018-12-07 VITALS — BP 114/82 | HR 91 | Temp 98.1°F | Ht 64.25 in | Wt 206.4 lb

## 2018-12-07 DIAGNOSIS — B9689 Other specified bacterial agents as the cause of diseases classified elsewhere: Secondary | ICD-10-CM

## 2018-12-07 DIAGNOSIS — F329 Major depressive disorder, single episode, unspecified: Secondary | ICD-10-CM

## 2018-12-07 DIAGNOSIS — N760 Acute vaginitis: Secondary | ICD-10-CM

## 2018-12-07 DIAGNOSIS — E669 Obesity, unspecified: Secondary | ICD-10-CM

## 2018-12-07 DIAGNOSIS — N898 Other specified noninflammatory disorders of vagina: Secondary | ICD-10-CM

## 2018-12-07 NOTE — Patient Instructions (Signed)
Good to see you today  I'll send you a mychart message about your results  Consider individual/couples counseling  Follow up this fall for you labs
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
## 2018-12-08 LAB — C. TRACHOMATIS/N. GONORRHOEAE RNA
C. trachomatis RNA, TMA: NOT DETECTED
N. gonorrhoeae RNA, TMA: NOT DETECTED

## 2018-12-08 LAB — WET PREP BY MOLECULAR PROBE
Candida species: NOT DETECTED
MICRO NUMBER:: 798047
SPECIMEN QUALITY:: ADEQUATE
Trichomonas vaginosis: NOT DETECTED

## 2018-12-10 ENCOUNTER — Encounter: Payer: Self-pay | Admitting: Family Medicine

## 2018-12-10 MED ORDER — METRONIDAZOLE 0.75 % VA GEL: 1.0000 | VAGINAL | 3 refills | Status: AC

## 2018-12-10 MED ORDER — METRONIDAZOLE 500 MG PO TABS: 500.0000 mg | ORAL_TABLET | Freq: Two times a day (BID) | ORAL | 0 refills | Status: DC

## 2018-12-10 NOTE — Addendum Note (Signed)
Addended by: Clarene Reamer B on: 12/10/2018 08:27 AM   Modules accepted: Orders

## 2018-12-18 ENCOUNTER — Encounter: Payer: Self-pay | Admitting: Family Medicine

## 2019-01-07 ENCOUNTER — Other Ambulatory Visit: Payer: BLUE CROSS/BLUE SHIELD

## 2019-01-07 ENCOUNTER — Other Ambulatory Visit (INDEPENDENT_AMBULATORY_CARE_PROVIDER_SITE_OTHER): Payer: BLUE CROSS/BLUE SHIELD

## 2019-01-07 ENCOUNTER — Encounter: Payer: Self-pay | Admitting: Family Medicine

## 2019-01-07 ENCOUNTER — Other Ambulatory Visit: Payer: Self-pay | Admitting: Family Medicine

## 2019-01-07 DIAGNOSIS — N926 Irregular menstruation, unspecified: Secondary | ICD-10-CM

## 2019-01-08 LAB — HCG, QUANTITATIVE, PREGNANCY: Quantitative HCG: <0.6 m[IU]/mL

## 2019-04-15 ENCOUNTER — Other Ambulatory Visit: Payer: Self-pay

## 2019-04-15 ENCOUNTER — Encounter: Payer: Self-pay | Admitting: Family Medicine

## 2019-04-15 ENCOUNTER — Ambulatory Visit: Payer: BLUE CROSS/BLUE SHIELD | Admitting: Family Medicine

## 2019-04-15 VITALS — BP 120/62 | HR 81 | Temp 98.3°F | Ht 64.25 in | Wt 201.8 lb

## 2019-04-15 DIAGNOSIS — E669 Obesity, unspecified: Secondary | ICD-10-CM

## 2019-04-15 DIAGNOSIS — N898 Other specified noninflammatory disorders of vagina: Secondary | ICD-10-CM

## 2019-04-15 DIAGNOSIS — R790 Abnormal level of blood mineral: Secondary | ICD-10-CM | POA: Diagnosis not present

## 2019-04-15 DIAGNOSIS — N926 Irregular menstruation, unspecified: Secondary | ICD-10-CM

## 2019-04-15 DIAGNOSIS — Z113 Encounter for screening for infections with a predominantly sexual mode of transmission: Secondary | ICD-10-CM | POA: Diagnosis not present

## 2019-04-15 LAB — POCT URINE PREGNANCY: Preg Test, Ur: NEGATIVE

## 2019-04-15 NOTE — Patient Instructions (Signed)
Good to see you today, I will notify you of results

## 2019-04-15 NOTE — Progress Notes (Signed)
Subjective:    Patient ID: Alexa Rosales, female    DOB: [birth year], 33 y.o.   MRN: [id number]  HPI Chief Complaint  Patient presents with  . Vaginal Discharge    Pt states that she has started back having symptoms x over the last week. Pt states that she noticed this flare up after having intercourse (3 weeks ago)   This is a 33 yo female who presents today with above cc. She and her husband have divorced. They had intercourse about 3 weeks ago. She requests std testing. Has had thin-thick white vaginal discharge. Small "hair bump," below bikini line on right side. Has been using metrogel occasionally for chronic BV prevention.  Anemia- increased compliance with iron supplementation.   Past Medical History:  Diagnosis Date  . History of anemia    Past Surgical History:  Procedure Laterality Date  . CESAREAN SECTION    . HERNIA REPAIR    . WISDOM TOOTH EXTRACTION     Family History  Problem Relation Age of Onset  . Diabetes Mother   . Hypertension Mother   . Sarcoidosis Father   . Asthma Sister   . Colon cancer Maternal Grandmother   . Breast cancer Paternal Grandmother   . Breast cancer Maternal Aunt        great MAT   Social History   Tobacco Use  . Smoking status: Never Smoker  . Smokeless tobacco: Never Used  Substance Use Topics  . Alcohol use: Yes    Comment: occassional  . Drug use: No      Review of Systems    per HPI Objective:   Physical Exam Vitals reviewed.  Constitutional:      General: She is not in acute distress.    Appearance: Normal appearance. She is obese. She is not ill-appearing, toxic-appearing or diaphoretic.  HENT:     Head: Normocephalic and atraumatic.  Cardiovascular:     Rate and Rhythm: Normal rate.  Pulmonary:     Effort: Pulmonary effort is normal.  Genitourinary:    Exam position: Supine.     Labia:        Right: No rash or tenderness.        Left: No rash or tenderness.      Comments: Right upper monds with 2 mm,  palpable, superficial lesion. Not visible.  Introitus with thick, white discharge. Wet prep obtained.  Skin:    General: Skin is warm and dry.  Neurological:     Mental Status: She is alert.       BP 120/62 (BP Location: Left Arm, Patient Position: Sitting, Cuff Size: Normal)   Pulse 81   Temp 98.3 F (36.8 C) (Temporal)   Ht 5' 4.25" (1.632 m)   Wt 201 lb 12.8 oz (91.5 kg)   SpO2 98%   BMI 34.37 kg/m  Wt Readings from Last 3 Encounters:  04/15/19 201 lb 12.8 oz (91.5 kg)  12/07/18 206 lb 6.4 oz (93.6 kg)  10/05/18 199 lb 8 oz (90.5 kg)   Results for orders placed or performed in visit on 04/15/19  POCT urine pregnancy  Result Value Ref Range   Preg Test, Ur Negative Negative       Assessment & Plan:  1. Missed period - POCT urine pregnancy - WET PREP BY MOLECULAR PROBE  2. Routine screening for STI (sexually transmitted infection) - C. trachomatis/N. gonorrhoeae RNA - RPR - HIV antibody - WET PREP BY MOLECULAR PROBE  3. Vaginal discharge -  WET PREP BY MOLECULAR PROBE  4. Low ferritin - Ferritin - CBC with Differential - WET PREP BY MOLECULAR PROBE  5. Obesity (BMI 30-39.9) - Hemoglobin A1c - WET PREP BY MOLECULAR PROBE  This visit occurred during the SARS-CoV-2 public health emergency.  Safety protocols were in place, including screening questions prior to the visit, additional usage of staff PPE, and extensive cleaning of exam room while observing appropriate contact time as indicated for disinfecting solutions.    Clarene Reamer, FNP-BC  [facility] at [facility], [place] [facility]  04/15/2019 3:09 PM

## 2019-04-16 ENCOUNTER — Encounter: Payer: Self-pay | Admitting: Family Medicine

## 2019-04-16 LAB — CBC WITH DIFFERENTIAL/PLATELET
Basophils Absolute: 0.1 10*3/uL (ref 0.0–0.1)
Basophils Relative: 0.8 % (ref 0.0–3.0)
Eosinophils Absolute: 0.1 10*3/uL (ref 0.0–0.7)
Eosinophils Relative: 1.1 % (ref 0.0–5.0)
HCT: 41.5 % (ref 36.0–46.0)
Hemoglobin: 14.1 g/dL (ref 12.0–15.0)
Lymphocytes Relative: 31.3 % (ref 12.0–46.0)
Lymphs Abs: 2 10*3/uL (ref 0.7–4.0)
MCHC: 34 g/dL (ref 30.0–36.0)
MCV: 93.2 fl (ref 78.0–100.0)
Monocytes Absolute: 0.5 10*3/uL (ref 0.1–1.0)
Monocytes Relative: 7.8 % (ref 3.0–12.0)
Neutro Abs: 3.7 10*3/uL (ref 1.4–7.7)
Neutrophils Relative %: 59 % (ref 43.0–77.0)
Platelets: 316 10*3/uL (ref 150.0–400.0)
RBC: 4.45 Mil/uL (ref 3.87–5.11)
RDW: 12.6 % (ref 11.5–15.5)
WBC: 6.3 10*3/uL (ref 4.0–10.5)

## 2019-04-16 LAB — C. TRACHOMATIS/N. GONORRHOEAE RNA
C. trachomatis RNA, TMA: NOT DETECTED
N. gonorrhoeae RNA, TMA: NOT DETECTED

## 2019-04-16 LAB — WET PREP BY MOLECULAR PROBE
Candida species: NOT DETECTED
MICRO NUMBER:: 1232886
SPECIMEN QUALITY:: ADEQUATE
Trichomonas vaginosis: NOT DETECTED

## 2019-04-16 LAB — HIV ANTIBODY (ROUTINE TESTING W REFLEX): HIV 1&2 Ab, 4th Generation: NONREACTIVE

## 2019-04-16 LAB — FERRITIN: Ferritin: 11.3 ng/mL (ref 10.0–291.0)

## 2019-04-16 LAB — RPR: RPR Ser Ql: NONREACTIVE

## 2019-04-16 LAB — HEMOGLOBIN A1C: Hgb A1c MFr Bld: 5.9 % (ref 4.6–6.5)

## 2019-04-17 ENCOUNTER — Other Ambulatory Visit: Payer: Self-pay | Admitting: Family Medicine

## 2019-04-17 DIAGNOSIS — B9689 Other specified bacterial agents as the cause of diseases classified elsewhere: Secondary | ICD-10-CM

## 2019-04-17 MED ORDER — METRONIDAZOLE 500 MG PO TABS: 500.0000 mg | ORAL_TABLET | Freq: Two times a day (BID) | ORAL | 0 refills | Status: DC

## 2019-05-15 ENCOUNTER — Encounter: Payer: Self-pay | Admitting: Family Medicine

## 2019-05-15 ENCOUNTER — Ambulatory Visit (INDEPENDENT_AMBULATORY_CARE_PROVIDER_SITE_OTHER): Payer: BLUE CROSS/BLUE SHIELD | Admitting: Family Medicine

## 2019-05-15 ENCOUNTER — Other Ambulatory Visit: Payer: Self-pay

## 2019-05-15 VITALS — HR 86 | Temp 98.6°F | Ht 64.25 in | Wt 198.0 lb

## 2019-05-15 DIAGNOSIS — R0981 Nasal congestion: Secondary | ICD-10-CM | POA: Diagnosis not present

## 2019-05-15 NOTE — Progress Notes (Signed)
Virtual Visit via Video Note  I connected with Ammarie Rund on 05/15/19 at  9:00 AM EST by a video enabled telemedicine application and verified that I am speaking with the correct person using two identifiers.  The patient was unable to connect with her camera so call was completed with audio only.  Location: Patient: at her home Provider: Marvin Persons participating in virtual visit: patient and provider   I discussed the limitations of evaluation and management by telemedicine and the availability of in person appointments. The patient expressed understanding and agreed to proceed.  History of Present Illness: Chief Complaint  Patient presents with  . Head Congestion    Seen by allergist, all was normal. Pt states that she is getting bright green mucous from her nose. Denies sinus pressure or pain. Taking Zyrtec, Flonase and using Netipot - not much relief. Pt states that she is snoring really bad at night bc she cannot breathe. Pt has been having sx's x 1 month  This is a 34 year old female who presents with above chief complaint.  She requests virtual visit today.  Main concern is nasal congestion and stuffiness.  She has a little bit of thick green nasal drainage.  A couple of days ago she had a little postnasal drainage and pain in her ears that resolved after 1 day.  She has not been running fevers.  She does not have any maxillary sinus tenderness but does have a dull frontal headache.  She denies any chest symptoms.  She had visit with her allergist in the last couple of months who did allergy testing and determined the only allergy was dust.  She was given XHANCE fluticasone nasal spray which she did not feel helped any more than traditional fluticasone nasal spray.  The allergist encouraged her to continue using her Nettie pot which she has.  They also discussed her seeing an ENT for possible anatomic causes of her symptoms.  She is currently taking Zyrtec, fluticasone  nasal spray daily, and using her Nettie pot.    Observations/Objective: Patient is alert and answers questions appropriately.  She is normally conversive without any signs of increased work of breathing.  Her mood and affect are appropriate. Pulse 86   Temp 98.6 F (37 C) (Temporal)   Ht 5' 4.25" (1.632 m)   Wt 198 lb (89.8 kg)   SpO2 98%   BMI 33.72 kg/m  Wt Readings from Last 3 Encounters:  05/15/19 198 lb (89.8 kg)  04/15/19 201 lb 12.8 oz (91.5 kg)  12/07/18 206 lb 6.4 oz (93.6 kg)    Assessment and Plan: 1. Chronic nasal congestion -Discussed with patient and will try the following prior to prescribing antibiotic-Afrin type nasal spray twice a day for 3 days, over-the-counter pseudoephedrine 1-2 times a day as needed, continue fluticasone nasal spray, continue Nettie pot.  Hold antihistamine for a couple of days and if she needs to restart consider trial of different antihistamine since she has been on Zyrtec for a long time. -If no improvement in a couple of days she will contact me and we will do a course of antibiotics. -Discussed that she may need to see ENT due to persistence of problem.   Clarene Reamer, FNP-BC  [facility] [facility] at [facility], [place] [facility]  05/15/2019 9:25 AM   Follow Up Instructions:    I discussed the assessment and treatment plan with the patient. The patient was provided an opportunity to ask questions and all  were answered. The patient agreed with the plan and demonstrated an understanding of the instructions.   The patient was advised to call back or seek an in-person evaluation if the symptoms worsen or if the condition fails to improve as anticipated.    Elby Beck, FNP

## 2019-06-06 ENCOUNTER — Encounter: Payer: Self-pay | Admitting: Family Medicine

## 2020-02-15 ENCOUNTER — Other Ambulatory Visit: Payer: Self-pay | Admitting: Family Medicine

## 2020-02-15 ENCOUNTER — Ambulatory Visit
Admission: RE | Admit: 2020-02-15 | Discharge: 2020-02-15 | Disposition: A | Discharge status: Home / Self Care | Payer: 59 | Source: Ambulatory Visit | Attending: Internal Medicine | Admitting: Internal Medicine

## 2020-02-15 ENCOUNTER — Other Ambulatory Visit: Payer: Self-pay

## 2020-02-15 VITALS — BP 121/83 | HR 93 | Temp 98.6°F | Resp 18 | Ht 66.0 in | Wt 187.0 lb

## 2020-02-15 DIAGNOSIS — N760 Acute vaginitis: Secondary | ICD-10-CM

## 2020-02-15 DIAGNOSIS — S0501XA Injury of conjunctiva and corneal abrasion without foreign body, right eye, initial encounter: Secondary | ICD-10-CM | POA: Diagnosis not present

## 2020-02-15 MED ORDER — CIPROFLOXACIN HCL 0.3 % OP SOLN: 1.0000 [drp] | OPHTHALMIC | 0 refills | Status: AC

## 2020-02-15 NOTE — ED Triage Notes (Signed)
Patient in today c/o right eye injury last night. Patient states she was poked in the right eye with "wings" from a Halloween costume. Patient has been using cool compresses to the right eye without relief. Patient states it hurts to keep her eye open.

## 2020-02-15 NOTE — Discharge Instructions (Signed)
Go to the eye doctor if you get worse in 24- 48 hours

## 2020-02-15 NOTE — ED Provider Notes (Signed)
[facility]    CSN: [id number] Arrival date & time: 02/15/20  1229      History   Chief Complaint Chief Complaint  Patient presents with  . Appointment  . Eye Injury    right    HPI Alexa Rosales is a 34 y.o. female who is here due to having R eye pain, tearing and photophobia since yesterday. She had her niece on her lap who was wearing a custom with wings and when the child turned the edge of the wings hit pt's R eye. She has been applying cold compresses. Woke up in pain this am and still with tearing. She denies FB sensation or vision changes.     Past Medical History:  Diagnosis Date  . History of anemia     Patient Active Problem List   Diagnosis Date Noted  . Chronic rhinitis 07/23/2018  . Abnormal uterine bleeding (AUB) 12/29/2015  . Family history of breast cancer in female 11/15/2011  . Allergic rhinitis 08/25/2011  . Genital herpes 08/25/2011    Past Surgical History:  Procedure Laterality Date  . CESAREAN SECTION    . HERNIA REPAIR    . WISDOM TOOTH EXTRACTION      OB History   No obstetric history on file.      Home Medications    Prior to Admission medications   Medication Sig Start Date End Date Taking? Authorizing Provider  cetirizine (ZYRTEC) 5 MG tablet Take 5 mg by mouth daily.   Yes [provider]  fluticasone (FLONASE) 50 MCG/ACT nasal spray Place 1-2 sprays into both nostrils daily as needed for allergies or rhinitis.   Yes [provider]  ciprofloxacin (CILOXAN) 0.3 % ophthalmic solution Place 1 drop into the right eye every 4 (four) hours while awake for 7 days. Administer 1 drop, every 2 hours, while awake, for 2 days. Then 1 drop, every 4 hours, while awake, for the next 5 days. 02/15/20 02/22/20  Rodriguez-Southworth, Sunday Spillers, PA-C  ferrous sulfate 325 (65 FE) MG tablet Take 325 mg by mouth daily with breakfast.  02/15/20  [provider]  norethindrone-ethinyl estradiol (JUNEL FE,GILDESS  FE,LOESTRIN FE) 1-20 MG-MCG tablet Take 1 tablet by mouth daily. 05/21/18 02/15/20  Elby Beck, FNP    Family History Family History  Problem Relation Age of Onset  . Diabetes Mother   . Hypertension Mother   . Sarcoidosis Father   . Asthma Sister   . Colon cancer Maternal Grandmother   . Breast cancer Paternal Grandmother   . Breast cancer Maternal Aunt        great MAT    Social History Social History   Tobacco Use  . Smoking status: Never Smoker  . Smokeless tobacco: Never Used  Vaping Use  . Vaping Use: Never used  Substance Use Topics  . Alcohol use: Yes    Comment: occassional  . Drug use: No     Allergies   Sulfa antibiotics   Review of Systems Review of Systems  Eyes: Positive for photophobia, pain and discharge. Negative for redness, itching and visual disturbance.       + tearing of R eye     Physical Exam Triage Vital Signs ED Triage Vitals  Enc Vitals Group     BP 02/15/20 1251 121/83     Pulse Rate 02/15/20 1251 93     Resp 02/15/20 1251 18     Temp 02/15/20 1251 98.6 F (37 C)     Temp  Source 02/15/20 1251 Oral     SpO2 02/15/20 1251 100 %     Weight 02/15/20 1251 187 lb (84.8 kg)     Height 02/15/20 1251 5\' 6"  (1.676 m)     Head Circumference --      Peak Flow --      Pain Score 02/15/20 1250 4     Pain Loc --      Pain Edu? --      Excl. in [place]? --    No data found.  Updated Vital Signs BP 121/83 (BP Location: Left Arm)   Pulse 93   Temp 98.6 F (37 C) (Oral)   Resp 18   Ht 5\' 6"  (1.676 m)   Wt 187 lb (84.8 kg)   LMP 01/11/2020 (Exact Date)   SpO2 100%   BMI 30.18 kg/m   Visual Acuity Right Eye Distance: 20/20 Left Eye Distance: 20/20 Bilateral Distance: 20/20  Right Eye Near:   Left Eye Near:    Bilateral Near:     Physical Exam Vitals and nursing note reviewed.  Constitutional:      General: She is not in acute distress.    Appearance: She is obese. She is not toxic-appearing.  HENT:     Right Ear:  External ear normal.     Left Ear: External ear normal.  Eyes:     General: Lids are normal. Lids are everted, no foreign bodies appreciated. No allergic shiner, visual field deficit or scleral icterus.       Right eye: No foreign body.     Extraocular Movements: Extraocular movements intact.     Conjunctiva/sclera: Conjunctivae normal.     Pupils: Pupils are equal, round, and reactive to light.     Right eye: Fluorescein uptake present.      Comments: R eye is tearing. Has mild injection of sclera, but no perilimbal erythema present. Has a punctate abrasion on cornea around 12 o'clock  Neurological:     Mental Status: She is alert.    UC Treatments / Results  Labs (all labs ordered are listed, but only abnormal results are displayed) Labs Reviewed - No data to display  EKG   Radiology No results found.  Procedures Procedures (including critical care time)  Medications Ordered in UC Medications - No data to display  Initial Impression / Assessment and Plan / UC Course  I have reviewed the triage vital signs and the nursing notes. Has R corneal abrasion. I placed her on Cipro eye gtts as noted. I reviewed symptoms of iritis and what to look for if she gets worse, and if so needs to see an ophthalmologist. Pt understands.  Final Clinical Impressions(s) / UC Diagnoses   Final diagnoses:  Corneal abrasion, right, initial encounter     Discharge Instructions     Go to the eye doctor if you get worse in 24- 48 hours     ED Prescriptions    Medication Sig Dispense Auth. Provider   ciprofloxacin (CILOXAN) 0.3 % ophthalmic solution Place 1 drop into the right eye every 4 (four) hours while awake for 7 days. Administer 1 drop, every 2 hours, while awake, for 2 days. Then 1 drop, every 4 hours, while awake, for the next 5 days. 5 mL Rodriguez-Southworth, Sunday Spillers, PA-C     PDMP not reviewed this encounter.   Shelby Mattocks, PA-C 02/15/20 1316

## 2020-02-17 NOTE — Telephone Encounter (Signed)
Refill request Metrogel Medication is no longer on medication list Last office visit 05/15/19

## 2020-02-21 ENCOUNTER — Ambulatory Visit: Payer: 59 | Admitting: Family Medicine

## 2020-03-11 ENCOUNTER — Encounter: Payer: Self-pay | Admitting: Family Medicine

## 2020-03-11 ENCOUNTER — Other Ambulatory Visit: Payer: Self-pay

## 2020-03-11 ENCOUNTER — Ambulatory Visit: Payer: 59 | Admitting: Family Medicine

## 2020-03-11 VITALS — BP 102/80 | HR 78 | Temp 97.6°F | Ht 64.25 in | Wt 200.0 lb

## 2020-03-11 DIAGNOSIS — N760 Acute vaginitis: Secondary | ICD-10-CM

## 2020-03-11 DIAGNOSIS — R7303 Prediabetes: Secondary | ICD-10-CM

## 2020-03-11 DIAGNOSIS — N898 Other specified noninflammatory disorders of vagina: Secondary | ICD-10-CM | POA: Diagnosis not present

## 2020-03-11 DIAGNOSIS — Z113 Encounter for screening for infections with a predominantly sexual mode of transmission: Secondary | ICD-10-CM | POA: Diagnosis not present

## 2020-03-11 DIAGNOSIS — B9689 Other specified bacterial agents as the cause of diseases classified elsewhere: Secondary | ICD-10-CM

## 2020-03-11 LAB — COMPREHENSIVE METABOLIC PANEL
ALT: 14 U/L (ref 0–35)
AST: 13 U/L (ref 0–37)
Albumin: 4.2 g/dL (ref 3.5–5.2)
Alkaline Phosphatase: 60 U/L (ref 39–117)
BUN: 11 mg/dL (ref 6–23)
CO2: 29 mEq/L (ref 19–32)
Calcium: 9.4 mg/dL (ref 8.4–10.5)
Chloride: 103 mEq/L (ref 96–112)
Creatinine, Ser: 0.83 mg/dL (ref 0.40–1.20)
GFR: 91.15 mL/min (ref >60.00)
Glucose, Bld: 104 mg/dL — ABNORMAL HIGH (ref 70–99)
Potassium: 4.2 mEq/L (ref 3.5–5.1)
Sodium: 138 mEq/L (ref 135–145)
Total Bilirubin: 0.7 mg/dL (ref 0.2–1.2)
Total Protein: 6.6 g/dL (ref 6.0–8.3)

## 2020-03-11 LAB — LIPID PANEL
Cholesterol: 170 mg/dL (ref 0–200)
HDL: 45.3 mg/dL (ref >39.00)
LDL Cholesterol: 109 mg/dL — ABNORMAL HIGH (ref 0–99)
NonHDL: 124.54
Total CHOL/HDL Ratio: 4
Triglycerides: 80 mg/dL (ref 0.0–149.0)
VLDL: 16 mg/dL (ref 0.0–40.0)

## 2020-03-11 LAB — HEMOGLOBIN A1C: Hgb A1c MFr Bld: 6 % (ref 4.6–6.5)

## 2020-03-11 NOTE — Progress Notes (Signed)
   Subjective:    Patient ID: Alexa Rosales, female    DOB: 04-Jul-1985, 34 y.o.   MRN: [id number]  HPI Chief Complaint  Patient presents with  . Vaginal Discharge    Intermittent 3-4 weeks with odor cottage cheese consistency. Itching started 3-4 days ago. Has tried some left over metronitazole gel for 4 days and it seemed to help.   This is a 34 yo female who presents today with above cc. 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  This is a 34 year old female who presents today with above chief complaint.  She reports some vaginal discharge with localized itching.  No rash.  She has had a new sexual partner and request screening for STDs today.  She denies dysuria, hematuria, urinary frequency.  She has not been seen since January of this year due to having a period of time where she did not have insurance coverage.  She is requesting follow-up labs today.    Review of Systems Per HPI    Objective:   Physical Exam Vitals reviewed.  Constitutional:      General: She is not in acute distress.    Appearance: Normal appearance. She is obese. She is not ill-appearing, toxic-appearing or diaphoretic.  HENT:     Head: Normocephalic and atraumatic.  Cardiovascular:     Rate and Rhythm: Normal rate.  Pulmonary:     Effort: Pulmonary effort is normal.  Genitourinary:    Vagina: Vaginal discharge (thin, clear at introitus, wet prep obtained. ) present.  Skin:    General: Skin is warm and dry.  Neurological:     Mental Status: She is alert and oriented to person, place, and time.  Psychiatric:        Mood and Affect: Mood normal.        Behavior: Behavior normal.        Thought Content: Thought content normal.        Judgment: Judgment normal.       BP 102/80 (BP Location: Left Arm, Patient Position: Sitting, Cuff Size: Large)   Pulse 78   Temp 97.6 F (36.4 C)   Ht 5' 4.25" (1.632 m)   Wt 200 lb (90.7 kg)    LMP 02/22/2020   SpO2 98%   BMI 34.06 kg/m  Wt Readings from Last 3 Encounters:  03/11/20 200 lb (90.7 kg)  02/15/20 187 lb (84.8 kg)  05/15/19 198 lb (89.8 kg)       Assessment & Plan:  1. Vaginal discharge - WET PREP BY MOLECULAR PROBE - C. trachomatis/N. gonorrhoeae RNA - Trichomonas vaginalis, RNA  2. Screening for STD (sexually transmitted disease) - HIV Antibody (routine testing w rflx) - RPR - Trichomonas vaginalis, RNA  3. Prediabetes - Hemoglobin A1c - Comprehensive metabolic panel - Lipid Panel  This visit occurred during the SARS-CoV-2 public health emergency.  Safety protocols were in place, including screening questions prior to the visit, additional usage of staff PPE, and extensive cleaning of exam room while observing appropriate contact time as indicated for disinfecting solutions.    Clarene Reamer, FNP-BC  [facility] [facility] at [facility] [place] [facility]  03/11/2020 3:52 PM

## 2020-03-11 NOTE — Patient Instructions (Signed)
Good to see you today  I will notify you of labs

## 2020-03-13 LAB — WET PREP BY MOLECULAR PROBE
Candida species: NOT DETECTED
MICRO NUMBER:: 11244526
SPECIMEN QUALITY:: ADEQUATE
Trichomonas vaginosis: NOT DETECTED

## 2020-03-13 LAB — TRICHOMONAS VAGINALIS, PROBE AMP: Trichomonas vaginalis RNA: NOT DETECTED

## 2020-03-13 LAB — HIV ANTIBODY (ROUTINE TESTING W REFLEX): HIV 1&2 Ab, 4th Generation: NONREACTIVE

## 2020-03-13 LAB — C. TRACHOMATIS/N. GONORRHOEAE RNA
C. trachomatis RNA, TMA: NOT DETECTED
N. gonorrhoeae RNA, TMA: NOT DETECTED

## 2020-03-13 LAB — RPR: RPR Ser Ql: NONREACTIVE

## 2020-03-15 MED ORDER — METRONIDAZOLE 0.75 % VA GEL: 1.0000 | Freq: Every day | VAGINAL | 0 refills | Status: DC

## 2020-03-15 NOTE — Addendum Note (Signed)
Addended by: Clarene Reamer B on: 03/15/2020 08:35 PM   Modules accepted: Orders

## 2020-03-16 ENCOUNTER — Other Ambulatory Visit: Payer: Self-pay | Admitting: Family Medicine

## 2020-03-16 ENCOUNTER — Encounter: Payer: Self-pay | Admitting: Family Medicine

## 2020-03-16 DIAGNOSIS — N760 Acute vaginitis: Secondary | ICD-10-CM

## 2020-03-16 MED ORDER — METRONIDAZOLE 500 MG PO TABS: 500.0000 mg | ORAL_TABLET | Freq: Two times a day (BID) | ORAL | 0 refills | Status: AC

## 2020-05-01 ENCOUNTER — Ambulatory Visit (INDEPENDENT_AMBULATORY_CARE_PROVIDER_SITE_OTHER): Payer: 59 | Admitting: Obstetrics

## 2020-05-01 ENCOUNTER — Other Ambulatory Visit: Payer: Self-pay

## 2020-05-01 ENCOUNTER — Encounter: Payer: Self-pay | Admitting: Obstetrics

## 2020-05-01 ENCOUNTER — Other Ambulatory Visit (HOSPITAL_COMMUNITY)
Admission: RE | Admit: 2020-05-01 | Discharge: 2020-05-01 | Disposition: A | Discharge status: Home / Self Care | Payer: 59 | Source: Ambulatory Visit | Attending: Obstetrics | Admitting: Obstetrics

## 2020-05-01 VITALS — BP 116/70 | Ht 66.0 in | Wt 200.2 lb

## 2020-05-01 DIAGNOSIS — Z01419 Encounter for gynecological examination (general) (routine) without abnormal findings: Secondary | ICD-10-CM | POA: Insufficient documentation

## 2020-05-01 DIAGNOSIS — Z113 Encounter for screening for infections with a predominantly sexual mode of transmission: Secondary | ICD-10-CM | POA: Diagnosis not present

## 2020-05-01 DIAGNOSIS — Z124 Encounter for screening for malignant neoplasm of cervix: Secondary | ICD-10-CM | POA: Insufficient documentation

## 2020-05-01 DIAGNOSIS — Z3041 Encounter for surveillance of contraceptive pills: Secondary | ICD-10-CM

## 2020-05-01 MED ORDER — NORETHIN ACE-ETH ESTRAD-FE 1-20 MG-MCG PO TABS: 1.0000 | ORAL_TABLET | Freq: Every day | ORAL | 11 refills | Status: DC

## 2020-05-01 NOTE — Progress Notes (Signed)
Annual Exam

## 2020-05-01 NOTE — Progress Notes (Signed)
. Gynecology Annual Exam  PCP: Elby Beck, FNP (Inactive)  Chief Complaint:  Chief Complaint  Patient presents with   Gynecologic Exam    Annual exam    History of Present Illness [name] is a 35 y.o. No obstetric history on file. who LMP was Patient's last menstrual period was 03/27/2020., presents today for her annual examination.  Her menses are present but irregular and she has used Jujnel in the past to regulate her periods. She is not presently using any hormonal BC.Marland Kitchen and she is sexually active. She does not have vasomotor sx. She uses Multivitamins, turmeric, Fish oil capsules, for meds.  She is single partner, contraception - none. She does not have vaginal dryness.  Last Pap: unknown.  " a long time ago" Hx of STDs: HSV  Last mammogram: one baseline done a number of years ago. Results were: normal There is no FH of breast cancer. There is no FH of ovarian cancer. The patient does do self-breast exams.  Colonoscopy: not yet due DEXA: has not been screened for osteoporosis  Tobacco use: The patient denies current or previous tobacco use. Alcohol use: social drinker Exercise: moderately active  She does get adequate calcium and Vitamin D in her diet.   The patient is sexually active. She currently uses None for contraception. The patient wears seatbelts: yes.   last pap: approximate date unknown. She is over due for a pap smear.  The patient has regular exercise: yes.  The patient has ever been transfused or tattooed?: yes.  The patient reports that domestic violence in her life is absent.    Review of Systems: Review of Systems  Constitutional: Negative.   HENT: Negative.   Eyes: Negative.   Respiratory: Negative.   Cardiovascular: Negative.   Gastrointestinal: Negative.   Genitourinary: Negative.   Skin: Negative.   Neurological: Negative.   Psychiatric/Behavioral: Negative.      Past Medical History:  Past Medical History:  Diagnosis  Date   History of anemia     Past Surgical History:  Past Surgical History:  Procedure Laterality Date   CESAREAN SECTION     HERNIA REPAIR     WISDOM TOOTH EXTRACTION      Medications: Prior to Admission medications   Medication Sig Start Date End Date Taking? Authorizing Provider  norethindrone-ethinyl estradiol (JUNEL FE 1/20) 1-20 MG-MCG tablet Take 1 tablet by mouth daily. 05/01/20  Yes Imagene Riches, CNM  cetirizine (ZYRTEC) 5 MG tablet Take 5 mg by mouth daily.    [provider]  fluticasone (FLONASE) 50 MCG/ACT nasal spray Place 1-2 sprays into both nostrils daily as needed for allergies or rhinitis.    [provider]  ferrous sulfate 325 (65 FE) MG tablet Take 325 mg by mouth daily with breakfast.  02/15/20  [provider]    Allergies:  Allergies  Allergen Reactions   Sulfa Antibiotics     Gynecologic History: Patient's last menstrual period was 03/27/2020.  Obstetric History: No obstetric history on file.  Social History:  Social History   Socioeconomic History   Marital status: Married    Spouse name: Not on file   Number of children: Not on file   Years of education: Not on file   Highest education level: Not on file  Occupational History   Not on file  Tobacco Use   Smoking status: Never Smoker   Smokeless tobacco: Never Used  Vaping Use   Vaping Use: Never used  Substance and Sexual Activity   Alcohol use: Yes    Comment: occassional   Drug use: No   Sexual activity: Yes  Other Topics Concern   Not on file  Social History Narrative   Not on file   Social Determinants of Health   Financial Resource Strain: Not on file  Food Insecurity: Not on file  Transportation Needs: Not on file  Physical Activity: Not on file  Stress: Not on file  Social Connections: Not on file  Intimate Partner Violence: Not on file    Family History:  Family History  Problem Relation Age of Onset   Diabetes  Mother    Hypertension Mother    Sarcoidosis Father    Asthma Sister    Colon cancer Maternal Grandmother    Breast cancer Paternal Grandmother    Breast cancer Maternal Aunt        great MAT     Physical Exam Vitals:  Vitals:   05/01/20 1038  BP: 116/70   Physical Exam Constitutional:      Appearance: Normal appearance. She is obese.  HENT:     Head: Normocephalic and atraumatic.     Nose: Nose normal.  Eyes:     Extraocular Movements: Extraocular movements intact.  Cardiovascular:     Rate and Rhythm: Normal rate and regular rhythm.     Heart sounds: Normal heart sounds.  Pulmonary:     Breath sounds: Normal breath sounds.  Abdominal:     General: Bowel sounds are normal.  Genitourinary:    General: Normal vulva.     Vagina: Vaginal discharge present.     Comments: Normal female anatomy No lesions ore rashes noted No enlarged Skenes or Bartholins glands. Vaginal mucosa appears normal- with some vaginal discharge noted Uterus is midline, non enlarged and anteverted. No anexal masses palpated. Musculoskeletal:        General: Normal range of motion.     Cervical back: Neck supple.  Skin:    General: Skin is warm and dry.  Neurological:     General: No focal deficit present.     Mental Status: She is oriented to person, place, and time.  Psychiatric:        Mood and Affect: Mood normal.        Behavior: Behavior normal.      Assessment: 35 y.o. No obstetric history on file. well woman exam  Plan:  1) Contraception Education given regarding options for contraception, including oral contraceptives. She stopped taking her OCPs a while ago. Encouraged her to start back on the [place] for cycle control and for contraceptive purposes. Will Rx them for her.  2) STI screening was offered- STI screening ordered today.  3) Pap done  4) Routine healthcare maintenance including cholesterol, diabetes screening per her PCP  5) Follow up 1 year for routine  annual exam  1. Women's annual routine gynecological examination Completed: - HEP, RPR, HIV Panel - Cytology - PAP - norethindrone-ethinyl estradiol (JUNEL FE 1/20) 1-20 MG-MCG tablet; Take 1 tablet by mouth daily.  Dispense: 28 tablet; Refill: 11  2. Cervical cancer screening performed - Cytology - PAP  3. Routine screening for STI (sexually transmitted infection) Offered and accepted - HEP, RPR, HIV Panel  4. Encounter for surveillance of contraceptive pills  - norethindrone-ethinyl estradiol (JUNEL FE 1/20) 1-20 MG-MCG tablet; Take 1 tablet by mouth daily.  Dispense: 28 tablet; Refill: [address], [place]  05/01/2020 4:50 PM

## 2020-05-02 LAB — HEP, RPR, HIV PANEL
HIV Screen 4th Generation wRfx: NONREACTIVE
Hepatitis B Surface Ag: NEGATIVE
RPR Ser Ql: NONREACTIVE

## 2020-05-04 ENCOUNTER — Ambulatory Visit: Payer: Self-pay | Admitting: Family

## 2020-05-04 LAB — CYTOLOGY - PAP
Adequacy: ABSENT
Chlamydia: NEGATIVE
Comment: NEGATIVE
Comment: NEGATIVE
Comment: NEGATIVE
Comment: NORMAL
High risk HPV: POSITIVE — AB
Neisseria Gonorrhea: NEGATIVE
Trichomonas: NEGATIVE

## 2020-05-05 ENCOUNTER — Other Ambulatory Visit: Payer: Self-pay | Admitting: Obstetrics

## 2020-05-05 ENCOUNTER — Encounter: Payer: Self-pay | Admitting: Obstetrics

## 2020-05-05 DIAGNOSIS — R87612 Low grade squamous intraepithelial lesion on cytologic smear of cervix (LGSIL): Secondary | ICD-10-CM

## 2020-05-05 DIAGNOSIS — Z124 Encounter for screening for malignant neoplasm of cervix: Secondary | ICD-10-CM

## 2020-05-20 ENCOUNTER — Ambulatory Visit: Payer: 59

## 2020-06-08 ENCOUNTER — Other Ambulatory Visit (HOSPITAL_COMMUNITY)
Admission: RE | Admit: 2020-06-08 | Discharge: 2020-06-08 | Disposition: A | Discharge status: Home / Self Care | Payer: 59 | Source: Ambulatory Visit | Attending: Obstetrics and Gynecology | Admitting: Obstetrics and Gynecology

## 2020-06-08 ENCOUNTER — Ambulatory Visit (INDEPENDENT_AMBULATORY_CARE_PROVIDER_SITE_OTHER): Payer: 59 | Admitting: Obstetrics and Gynecology

## 2020-06-08 ENCOUNTER — Encounter: Payer: Self-pay | Admitting: Obstetrics and Gynecology

## 2020-06-08 ENCOUNTER — Other Ambulatory Visit: Payer: Self-pay

## 2020-06-08 VITALS — BP 126/74 | Ht 66.0 in | Wt 201.0 lb

## 2020-06-08 DIAGNOSIS — R87612 Low grade squamous intraepithelial lesion on cytologic smear of cervix (LGSIL): Secondary | ICD-10-CM | POA: Diagnosis present

## 2020-06-08 NOTE — Progress Notes (Signed)
HPI:  Alexa Rosales is a 35 y.o.  No obstetric history on file.  who presents today for evaluation and management of abnormal cervical cytology.    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    Past Medical History:  Diagnosis Date  . History of anemia     Past Surgical History:  Procedure Laterality Date  . CESAREAN SECTION    . HERNIA REPAIR    . WISDOM TOOTH EXTRACTION      SOCIAL HISTORY:  Social History   Substance and Sexual Activity  Alcohol Use Yes   Comment: occassional    Social History   Substance and Sexual Activity  Drug Use No     Family History  Problem Relation Age of Onset  . Diabetes Mother   . Hypertension Mother   . Sarcoidosis Father   . Asthma Sister   . Colon cancer Maternal Grandmother   . Breast cancer Paternal Grandmother   . Breast cancer Maternal Aunt        great MAT    ALLERGIES:  Sulfa antibiotics  Current Outpatient Medications on File Prior to Visit  Medication Sig Dispense Refill  . cetirizine (ZYRTEC) 5 MG tablet Take 5 mg by mouth daily.    . fluticasone (FLONASE) 50 MCG/ACT nasal spray Place 1-2 sprays into both nostrils daily as needed for allergies or rhinitis.    Marland Kitchen norethindrone-ethinyl estradiol (JUNEL FE 1/20) 1-20 MG-MCG tablet Take 1 tablet by mouth daily. 28 tablet 11  . [DISCONTINUED] ferrous sulfate 325 (65 FE) MG tablet Take 325 mg by mouth daily with breakfast.     No current facility-administered medications on file prior to visit.    Physical Exam: -Vitals:  BP 126/74   Ht 5\' 6"  (1.676 m)   Wt 201 lb (91.2 kg)   LMP 05/08/2020   BMI 32.44 kg/m  GEN: WD, WN, NAD.  A+ O x 3, good mood and affect. ABD:  NT, ND.  Soft, no masses.  No hernias noted.   Pelvic:   Vulva: Normal appearance.  No lesions.  Vagina: No lesions or abnormalities noted.  Support: Normal pelvic support.  Urethra No masses tenderness or scarring.  Meatus Normal size  without lesions or prolapse.  Cervix: See below.  Anus: Normal exam.  No lesions.  Perineum: Normal exam.  No lesions.        Bimanual   Uterus: Normal size.  Non-tender.  Mobile.  AV.  Adnexae: No masses.  Non-tender to palpation.  Cul-de-sac: Negative for abnormality.   PROCEDURE: 1.  Urine Pregnancy Test:  negative 2.  Colposcopy performed with 4% acetic acid after verbal consent obtained                                         -Aceto-white Lesions Location(s): 2, 6, 10, and 12 o'clock.              -Biopsy performed at 2, 6, 10, and 12 o'clock.              -ECC indicated and performed: Yes.       -Biopsy sites made hemostatic with pressure, AgNO3, and/or Monsel's solution   -Satisfactory colposcopy: No.    -Evidence of Invasive cervical CA :  NO  ASSESSMENT:  Alexa Rosales is a 35 y.o. No obstetric history  on file. here for  1. LGSIL on Pap smear of cervix   .  PLAN: I discussed the grading system of pap smears and HPV high risk viral types.  We will discuss and base management after colpo results return.    Prentice Docker, MD  [facility], [facility] 06/08/2020  2:51 PM

## 2020-06-10 LAB — SURGICAL PATHOLOGY

## 2020-06-11 ENCOUNTER — Telehealth: Payer: Self-pay | Admitting: Obstetrics and Gynecology

## 2020-06-11 NOTE — Telephone Encounter (Signed)
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 Recommend follow up pap smear in one year. Discussed that getting this pap smear in very important in preventing cervical cancer. She voiced understanding and agreement.

## 2020-06-15 ENCOUNTER — Other Ambulatory Visit (HOSPITAL_COMMUNITY)
Admission: RE | Admit: 2020-06-15 | Discharge: 2020-06-15 | Disposition: A | Discharge status: Home / Self Care | Payer: 59 | Source: Ambulatory Visit | Attending: Obstetrics and Gynecology | Admitting: Obstetrics and Gynecology

## 2020-06-15 ENCOUNTER — Other Ambulatory Visit: Payer: Self-pay

## 2020-06-15 ENCOUNTER — Ambulatory Visit (INDEPENDENT_AMBULATORY_CARE_PROVIDER_SITE_OTHER): Payer: 59 | Admitting: Obstetrics and Gynecology

## 2020-06-15 ENCOUNTER — Encounter: Payer: Self-pay | Admitting: Obstetrics and Gynecology

## 2020-06-15 VITALS — BP 122/74 | Wt 198.0 lb

## 2020-06-15 DIAGNOSIS — Z113 Encounter for screening for infections with a predominantly sexual mode of transmission: Secondary | ICD-10-CM | POA: Diagnosis present

## 2020-06-15 DIAGNOSIS — N760 Acute vaginitis: Secondary | ICD-10-CM

## 2020-06-15 DIAGNOSIS — B9689 Other specified bacterial agents as the cause of diseases classified elsewhere: Secondary | ICD-10-CM

## 2020-06-15 MED ORDER — METRONIDAZOLE 500 MG PO TABS: 500.0000 mg | ORAL_TABLET | Freq: Two times a day (BID) | ORAL | 0 refills | Status: AC

## 2020-06-15 NOTE — Progress Notes (Signed)
Obstetrics & Gynecology Office Visit   Chief Complaint:  Chief Complaint  Patient presents with  . Vaginitis    History of Present Illness: Vaginitis: Patient complains of an abnormal vaginal discharge for 1 week. Vaginal symptoms include local irritation and odor.Vulvar symptoms include local irritation.STI Risk: Possible STD exposureDischarge described as: yellow.Other associated symptoms: odor.Menstrual pattern: She had been bleeding regularly.   Last pap: 05/01/2020: LGSIL, HPV+ Colposcopy 1 week ago: CIN 1    Past Medical History:  Diagnosis Date  . History of anemia     Past Surgical History:  Procedure Laterality Date  . CESAREAN SECTION    . HERNIA REPAIR    . WISDOM TOOTH EXTRACTION      Gynecologic History: No LMP recorded. (Menstrual status: Irregular Periods).  Obstetric History: No obstetric history on file.  Family History  Problem Relation Age of Onset  . Diabetes Mother   . Hypertension Mother   . Sarcoidosis Father   . Asthma Sister   . Colon cancer Maternal Grandmother   . Breast cancer Paternal Grandmother   . Breast cancer Maternal Aunt        great MAT    Social History   Socioeconomic History  . Marital status: Married    Spouse name: Not on file  . Number of children: Not on file  . Years of education: Not on file  . Highest education level: Not on file  Occupational History  . Not on file  Tobacco Use  . Smoking status: Never Smoker  . Smokeless tobacco: Never Used  Vaping Use  . Vaping Use: Never used  Substance and Sexual Activity  . Alcohol use: Yes    Comment: occassional  . Drug use: No  . Sexual activity: Yes  Other Topics Concern  . Not on file  Social History Narrative  . Not on file   Social Determinants of Health   Financial Resource Strain: Not on file  Food Insecurity: Not on file  Transportation Needs: Not on file  Physical Activity: Not on file  Stress: Not on file  Social Connections: Not on file   Intimate Partner Violence: Not on file    Allergies  Allergen Reactions  . Sulfa Antibiotics     Prior to Admission medications   Medication Sig Start Date End Date Taking? Authorizing Provider  cetirizine (ZYRTEC) 5 MG tablet Take 5 mg by mouth daily.    [provider]  fluticasone (FLONASE) 50 MCG/ACT nasal spray Place 1-2 sprays into both nostrils daily as needed for allergies or rhinitis.    [provider]  norethindrone-ethinyl estradiol (JUNEL FE 1/20) 1-20 MG-MCG tablet Take 1 tablet by mouth daily. 05/01/20   Imagene Riches, CNM  ferrous sulfate 325 (65 FE) MG tablet Take 325 mg by mouth daily with breakfast.  02/15/20  [provider]    Review of Systems  Constitutional: Negative.   HENT: Negative.   Eyes: Negative.   Respiratory: Negative.   Cardiovascular: Negative.   Gastrointestinal: Negative.   Genitourinary: Negative.        See HPI  Musculoskeletal: Negative.   Skin: Negative.   Neurological: Negative.   Psychiatric/Behavioral: Negative.      Physical Exam BP 122/74   Wt 198 lb (89.8 kg)   BMI 31.96 kg/m  No LMP recorded. (Menstrual status: Irregular Periods). Physical Exam Constitutional:      General: She is not in acute distress.    Appearance: Normal appearance. She is well-developed.  Genitourinary:     Vulva, bladder and urethral meatus normal.     Right Labia: No rash, tenderness, lesions, skin changes or Bartholin's cyst.    Left Labia: No tenderness, skin changes, Bartholin's cyst or rash.    No inguinal adenopathy present in the right or left side.    Pelvic Tanner Score: 5/5.     Right Adnexa: not tender, not full and no mass present.    Left Adnexa: not tender, not full and no mass present.    No cervical motion tenderness, friability, lesion or polyp.     Uterus is not enlarged, fixed or tender.     Uterus is anteverted.     No urethral tenderness or mass present.     Pelvic exam was performed with  patient in the lithotomy position.  HENT:     Head: Normocephalic and atraumatic.  Eyes:     General: No scleral icterus.    Conjunctiva/sclera: Conjunctivae normal.  Cardiovascular:     Rate and Rhythm: Normal rate and regular rhythm.     Heart sounds: No murmur heard. No friction rub. No gallop.   Pulmonary:     Effort: Pulmonary effort is normal. No respiratory distress.     Breath sounds: Normal breath sounds. No wheezing or rales.  Abdominal:     General: Bowel sounds are normal. There is no distension.     Palpations: Abdomen is soft. There is no mass.     Tenderness: There is no abdominal tenderness. There is no guarding or rebound.     Hernia: There is no hernia in the left inguinal area or right inguinal area.  Musculoskeletal:        General: Normal range of motion.     Cervical back: Normal range of motion and neck supple.  Lymphadenopathy:     Lower Body: No right inguinal adenopathy. No left inguinal adenopathy.  Neurological:     General: No focal deficit present.     Mental Status: She is alert and oriented to person, place, and time.     Cranial Nerves: No cranial nerve deficit.  Skin:    General: Skin is warm and dry.     Findings: No erythema.  Psychiatric:        Mood and Affect: Mood normal.        Behavior: Behavior normal.        Judgment: Judgment normal.    Female chaperone present for pelvic and breast  portions of the physical exam  Wet Prep: PH: 5.5 Clue Cells: Positive Fungal elements: Negative Trichomonas: Negative   Assessment: 35 y.o. No obstetric history on file. female here for  1. Bacterial vaginosis   2. Screen for STD (sexually transmitted disease)      Plan: Problem List Items Addressed This Visit   None   Visit Diagnoses    Bacterial vaginosis    -  Primary   Relevant Medications   metroNIDAZOLE (FLAGYL) 500 MG tablet   Screen for STD (sexually transmitted disease)       Relevant Orders   Cervicovaginal ancillary only      Treatment as above.   Screen for STI per patient request.  A total of 22 minutes were spent face-to-face with the patient as well as preparation, review, communication, and documentation during this encounter.    Prentice Docker, MD 06/15/2020 5:02 PM

## 2020-06-17 ENCOUNTER — Ambulatory Visit
Admission: EM | Admit: 2020-06-17 | Discharge: 2020-06-17 | Disposition: A | Discharge status: Home / Self Care | Payer: 59 | Attending: Medical Oncology | Admitting: Medical Oncology

## 2020-06-17 ENCOUNTER — Other Ambulatory Visit: Payer: Self-pay

## 2020-06-17 DIAGNOSIS — M5431 Sciatica, right side: Secondary | ICD-10-CM | POA: Diagnosis not present

## 2020-06-17 DIAGNOSIS — M62838 Other muscle spasm: Secondary | ICD-10-CM

## 2020-06-17 DIAGNOSIS — M5432 Sciatica, left side: Secondary | ICD-10-CM | POA: Diagnosis not present

## 2020-06-17 LAB — CERVICOVAGINAL ANCILLARY ONLY
Chlamydia: NEGATIVE
Comment: NEGATIVE
Comment: NEGATIVE
Comment: NORMAL
Neisseria Gonorrhea: NEGATIVE
Trichomonas: NEGATIVE

## 2020-06-17 MED ORDER — CYCLOBENZAPRINE HCL 5 MG PO TABS: 5.0000 mg | ORAL_TABLET | Freq: Three times a day (TID) | ORAL | 0 refills | Status: DC | PRN

## 2020-06-17 NOTE — ED Triage Notes (Signed)
Pt c/o BLE leg pain that started last night. Pt reports pain started in her calves and seemed to cause her legs to go numb. Pt reports pain has continued today. She states she has increased pain with walking. She does wear compression stockings while working as she is on her feet a lot, she works in the hospital. She denies swelling, redness, warmth or other changes to her skin/legs. She does report recent significant headache for several days, that did improve; she denies hx of migraines.

## 2020-06-17 NOTE — ED Provider Notes (Signed)
[facility]    CSN: [id number] Arrival date & time: 06/17/20  1636      History   Chief Complaint Chief Complaint  Patient presents with  . Leg Pain    BLE    HPI Alexa Rosales is a 35 y.o. female.   HPI   Leg Pain: Pt reports that she has has had bilateral leg pain for the past few hours. Symptoms started last night and after a stressful shift at work where she had to respond to a code blue. She reports that the pain radiates from her lower back to her calves on both sides. Occurs mostly with bending. Of note, she wears compression socks as she stands most of the day on her feet at work. She denies calf swelling, calf pain, redness, injury, warmth, neuro changes. Of note she did have a tension headache but this has resolved.   Past Medical History:  Diagnosis Date  . History of anemia     Patient Active Problem List   Diagnosis Date Noted  . Chronic rhinitis 07/23/2018  . Abnormal uterine bleeding (AUB) 12/29/2015  . Family history of breast cancer in female 11/15/2011  . Allergic rhinitis 08/25/2011  . Genital herpes 08/25/2011    Past Surgical History:  Procedure Laterality Date  . CESAREAN SECTION    . HERNIA REPAIR    . WISDOM TOOTH EXTRACTION      OB History   No obstetric history on file.      Home Medications    Prior to Admission medications   Medication Sig Start Date End Date Taking? Authorizing Provider  cetirizine (ZYRTEC) 5 MG tablet Take 5 mg by mouth daily.   Yes [provider]  fluticasone (FLONASE) 50 MCG/ACT nasal spray Place 1-2 sprays into both nostrils daily as needed for allergies or rhinitis.   Yes [provider]  metroNIDAZOLE (FLAGYL) 500 MG tablet Take 1 tablet (500 mg total) by mouth 2 (two) times daily for 7 days. 06/15/20 06/22/20 Yes Will Bonnet, MD  norethindrone-ethinyl estradiol (JUNEL FE 1/20) 1-20 MG-MCG tablet Take 1 tablet by mouth daily. 05/01/20  Yes Imagene Riches, CNM  ferrous  sulfate 325 (65 FE) MG tablet Take 325 mg by mouth daily with breakfast.  02/15/20  [provider]    Family History Family History  Problem Relation Age of Onset  . Diabetes Mother   . Hypertension Mother   . Sarcoidosis Father   . Asthma Sister   . Colon cancer Maternal Grandmother   . Breast cancer Paternal Grandmother   . Breast cancer Maternal Aunt        great MAT    Social History Social History   Tobacco Use  . Smoking status: Never Smoker  . Smokeless tobacco: Never Used  Vaping Use  . Vaping Use: Never used  Substance Use Topics  . Alcohol use: Yes    Comment: occassional  . Drug use: No     Allergies   Sulfa antibiotics   Review of Systems Review of Systems  As stated above in HPI Physical Exam Triage Vital Signs ED Triage Vitals  Enc Vitals Group     BP 06/17/20 1651 136/79     Pulse Rate 06/17/20 1651 94     Resp 06/17/20 1651 18     Temp 06/17/20 1651 98.3 F (36.8 C)     Temp Source 06/17/20 1651 Oral     SpO2 06/17/20 1651 99 %  Weight 06/17/20 1650 198 lb (89.8 kg)     Height 06/17/20 1650 5\' 5"  (1.651 m)     Head Circumference --      Peak Flow --      Pain Score 06/17/20 1650 10     Pain Loc --      Pain Edu? --      Excl. in [place]? --    No data found.  Updated Vital Signs BP 136/79 (BP Location: Left Arm)   Pulse 94   Temp 98.3 F (36.8 C) (Oral)   Resp 18   Ht 5\' 5"  (1.651 m)   Wt 198 lb (89.8 kg)   SpO2 99%   BMI 32.95 kg/m   Physical Exam Vitals and nursing note reviewed.  Constitutional:      General: She is not in acute distress.    Appearance: Normal appearance. She is not ill-appearing, toxic-appearing or diaphoretic.  Eyes:     Extraocular Movements: Extraocular movements intact.  Neck:     Vascular: No carotid bruit.  Cardiovascular:     Rate and Rhythm: Normal rate and regular rhythm.     Heart sounds: Normal heart sounds.  Pulmonary:     Effort: Pulmonary effort is normal.     Breath  sounds: Normal breath sounds.  Musculoskeletal:        General: Normal range of motion.     Cervical back: Normal range of motion and neck supple. No rigidity or tenderness.     Comments: Significant muscle tension bilaterally but worse on the right side especially in the lumbar region. Reproducible tenderness of neck and shoulder areas. ROM back intact. Normal straight leg raise bilaterally   Lymphadenopathy:     Cervical: No cervical adenopathy.  Skin:    General: Skin is warm.     Coloration: Skin is not jaundiced or pale.     Findings: No erythema.  Neurological:     General: No focal deficit present.     Mental Status: She is alert.     Sensory: No sensory deficit.     Motor: No weakness.     Coordination: Coordination normal.     Gait: Gait normal.     Deep Tendon Reflexes: Reflexes normal.      UC Treatments / Results  Labs (all labs ordered are listed, but only abnormal results are displayed) Labs Reviewed - No data to display  EKG   Radiology No results found.  Procedures Procedures (including critical care time)  Medications Ordered in UC Medications - No data to display  Initial Impression / Assessment and Plan / UC Course  I have reviewed the triage vital signs and the nursing notes.  Pertinent labs & imaging results that were available during my care of the patient were reviewed by me and considered in my medical decision making (see chart for details).    New.  Likely her stressful day at work and responding to the code situation caused worsening of her chronic muscle tension which has caused her symptoms.  I discussed with patient.  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  We also discussed using a heating pad or taking a hot shower along with tennis ball massages and stretching exercises.  We discussed treatment options such as NSAIDs, muscle relaxers and prednisone.  She elects to proceed  forward with muscle relaxers and stretching exercises.  We discussed how to use  along with common potential side effects and precautions.  Discussed red flag signs and symptoms. Final Clinical Impressions(s) / UC Diagnoses   Final diagnoses:  None   Discharge Instructions   None    ED Prescriptions    None     PDMP not reviewed this encounter.   Hughie Closs, [place] 06/17/20 1718

## 2020-10-20 ENCOUNTER — Emergency Department: Payer: 59

## 2020-10-20 ENCOUNTER — Telehealth: Payer: Self-pay

## 2020-10-20 ENCOUNTER — Other Ambulatory Visit: Payer: Self-pay

## 2020-10-20 ENCOUNTER — Ambulatory Visit: Payer: 59 | Admitting: Primary Care

## 2020-10-20 ENCOUNTER — Emergency Department
Admission: EM | Admit: 2020-10-20 | Discharge: 2020-10-20 | Disposition: A | Discharge status: Home / Self Care | Payer: 59 | Attending: Emergency Medicine | Admitting: Emergency Medicine

## 2020-10-20 DIAGNOSIS — R791 Abnormal coagulation profile: Secondary | ICD-10-CM | POA: Insufficient documentation

## 2020-10-20 DIAGNOSIS — R519 Headache, unspecified: Secondary | ICD-10-CM | POA: Diagnosis not present

## 2020-10-20 DIAGNOSIS — R200 Anesthesia of skin: Secondary | ICD-10-CM | POA: Diagnosis not present

## 2020-10-20 LAB — COMPREHENSIVE METABOLIC PANEL
ALT: 21 U/L (ref 0–44)
AST: 17 U/L (ref 15–41)
Albumin: 4.6 g/dL (ref 3.5–5.0)
Alkaline Phosphatase: 67 U/L (ref 38–126)
Anion gap: 6 (ref 5–15)
BUN: 17 mg/dL (ref 6–20)
CO2: 30 mmol/L (ref 22–32)
Calcium: 9.4 mg/dL (ref 8.9–10.3)
Chloride: 103 mmol/L (ref 98–111)
Creatinine, Ser: 0.69 mg/dL (ref 0.44–1.00)
GFR, Estimated: >60 mL/min (ref >60)
Glucose, Bld: 97 mg/dL (ref 70–99)
Potassium: 3.6 mmol/L (ref 3.5–5.1)
Sodium: 139 mmol/L (ref 135–145)
Total Bilirubin: 0.7 mg/dL (ref 0.3–1.2)
Total Protein: 7.8 g/dL (ref 6.5–8.1)

## 2020-10-20 LAB — DIFFERENTIAL
Abs Immature Granulocytes: 0.02 10*3/uL (ref 0.00–0.07)
Basophils Absolute: 0 10*3/uL (ref 0.0–0.1)
Basophils Relative: 1 %
Eosinophils Absolute: 0.1 10*3/uL (ref 0.0–0.5)
Eosinophils Relative: 2 %
Immature Granulocytes: 0 %
Lymphocytes Relative: 39 %
Lymphs Abs: 3.2 10*3/uL (ref 0.7–4.0)
Monocytes Absolute: 0.6 10*3/uL (ref 0.1–1.0)
Monocytes Relative: 7 %
Neutro Abs: 4.3 10*3/uL (ref 1.7–7.7)
Neutrophils Relative %: 51 %

## 2020-10-20 LAB — CBC
HCT: 42.6 % (ref 36.0–46.0)
Hemoglobin: 14.4 g/dL (ref 12.0–15.0)
MCH: 30.7 pg (ref 26.0–34.0)
MCHC: 33.8 g/dL (ref 30.0–36.0)
MCV: 90.8 fL (ref 80.0–100.0)
Platelets: 340 10*3/uL (ref 150–400)
RBC: 4.69 MIL/uL (ref 3.87–5.11)
RDW: 12.1 % (ref 11.5–15.5)
WBC: 8.2 10*3/uL (ref 4.0–10.5)
nRBC: 0 % (ref 0.0–0.2)

## 2020-10-20 LAB — POC URINE PREG, ED: Preg Test, Ur: NEGATIVE

## 2020-10-20 LAB — APTT: aPTT: 55 seconds — ABNORMAL HIGH (ref 24–36)

## 2020-10-20 LAB — PROTIME-INR
INR: 1 (ref 0.8–1.2)
Prothrombin Time: 12.9 seconds (ref 11.4–15.2)

## 2020-10-20 MED ORDER — GADOBUTROL 1 MMOL/ML IV SOLN
9.0000 mL | Freq: Once | INTRAVENOUS | Status: AC | PRN
  Administered 2020-10-20: 9 mL via INTRAVENOUS
  Filled 2020-10-20: qty 10

## 2020-10-20 MED ORDER — SODIUM CHLORIDE 0.9% FLUSH: 3.0000 mL | Freq: Once | INTRAVENOUS | Status: DC

## 2020-10-20 NOTE — Telephone Encounter (Signed)
Patient was seen today in the emergency room for her symptoms.  In addition, she already has an ER follow up scheduled with Allie Bossier, NP for this Thursday 10/22/20 at 940am.   FYI to Allie Bossier, NP.

## 2020-10-20 NOTE — ED Triage Notes (Signed)
Pt comes with c/o left sided numbness arm and leg. Pt states this has been going on for few months. Pt states it started again today. Pt states it usually lasts about 10 minutes.  Pt denies any CP, SOB, dizziness or blurry vision.

## 2020-10-20 NOTE — ED Provider Notes (Signed)
[facility] Emergency Department Provider Note  ____________________________________________   Event Date/Time   First MD Initiated Contact with Patient 10/20/20 1056     (approximate)  I have reviewed the triage vital signs and the nursing notes.   HISTORY  Chief Complaint Numbness    HPI Alexa Rosales is a 35 y.o. female with prior history of anemia who comes in with concern for numbness.  Patient reports episodes of full left-sided body numbness that occur intermittently.  Last episode was 3 weeks ago.  They have been going on for little over a month.  She had an episode today that lasted about 30 minutes.  Was not her entire left arm, left leg and a little bit of her left face.  No weakness or aphasia associated with it.  Nothing in general brings it on, gets better on its own.  Denies any family history of MS.          Past Medical History:  Diagnosis Date   History of anemia     Patient Active Problem List   Diagnosis Date Noted   Chronic rhinitis 07/23/2018   Abnormal uterine bleeding (AUB) 12/29/2015   Family history of breast cancer in female 11/15/2011   Allergic rhinitis 08/25/2011   Genital herpes 08/25/2011    Past Surgical History:  Procedure Laterality Date   CESAREAN SECTION     HERNIA REPAIR     WISDOM TOOTH EXTRACTION      Prior to Admission medications   Medication Sig Start Date End Date Taking? Authorizing Provider  cetirizine (ZYRTEC) 5 MG tablet Take 5 mg by mouth daily.    [provider]  cyclobenzaprine (FLEXERIL) 5 MG tablet Take 1 tablet (5 mg total) by mouth 3 (three) times daily as needed for muscle spasms. 06/17/20   Hughie Closs, PA-C  fluticasone (FLONASE) 50 MCG/ACT nasal spray Place 1-2 sprays into both nostrils daily as needed for allergies or rhinitis.    [provider]  norethindrone-ethinyl estradiol (JUNEL FE 1/20) 1-20 MG-MCG tablet Take 1 tablet by mouth daily. 05/01/20    Imagene Riches, CNM  ferrous sulfate 325 (65 FE) MG tablet Take 325 mg by mouth daily with breakfast.  02/15/20  [provider]    Allergies Sulfa antibiotics  Family History  Problem Relation Age of Onset   Diabetes Mother    Hypertension Mother    Sarcoidosis Father    Asthma Sister    Colon cancer Maternal Grandmother    Breast cancer Paternal Grandmother    Breast cancer Maternal Aunt        great MAT    Social History Social History   Tobacco Use   Smoking status: Never   Smokeless tobacco: Never  Vaping Use   Vaping Use: Never used  Substance Use Topics   Alcohol use: Yes    Comment: occassional   Drug use: No      Review of Systems Constitutional: No fever/chills Eyes: No visual changes. ENT: No sore throat. Cardiovascular: Denies chest pain. Respiratory: Denies shortness of breath. Gastrointestinal: No abdominal pain.  No nausea, no vomiting.  No diarrhea.  No constipation. Genitourinary: Negative for dysuria. Musculoskeletal: Negative for back pain. Skin: Negative for rash. Neurological: Negative for headaches, positive numbness All other ROS negative ____________________________________________   PHYSICAL EXAM:  VITAL SIGNS: ED Triage Vitals  Enc Vitals Group     BP 10/20/20 1007 (!) 144/92     Pulse Rate 10/20/20 1007 65  Resp 10/20/20 1007 18     Temp 10/20/20 1007 98 F (36.7 C)     Temp src --      SpO2 10/20/20 1007 100 %     Weight 10/20/20 1007 198 lb (89.8 kg)     Height 10/20/20 1007 5\' 4"  (1.626 m)     Head Circumference --      Peak Flow --      Pain Score 10/20/20 1005 0     Pain Loc --      Pain Edu? --      Excl. in [place]? --     Constitutional: Alert and oriented. Well appearing and in no acute distress. Eyes: Conjunctivae are normal. EOMI. Head: Atraumatic. Nose: No congestion/rhinnorhea. Mouth/Throat: Mucous membranes are moist.   Neck: No stridor. Trachea Midline. FROM Cardiovascular: Normal rate,  regular rhythm. Grossly normal heart sounds.  Good peripheral circulation. Respiratory: Normal respiratory effort.  No retractions. Lungs CTAB. Gastrointestinal: Soft and nontender. No distention. No abdominal bruits.  Musculoskeletal: No lower extremity tenderness nor edema.  No joint effusions. Neurologic:  Normal speech and language. No gross focal neurologic deficits are appreciated.  Clear nerves II 12 are intact.  Patient reports numbness is mostly resolved.  Maybe a little bit of residual numbness in the left arm. Skin:  Skin is warm, dry and intact. No rash noted. Psychiatric: Mood and affect are normal. Speech and behavior are normal. GU: Deferred   ____________________________________________   LABS (all labs ordered are listed, but only abnormal results are displayed)  Labs Reviewed  APTT - Abnormal; Notable for the following components:      Result Value   aPTT 55 (*)    All other components within normal limits  PROTIME-INR  CBC  DIFFERENTIAL  COMPREHENSIVE METABOLIC PANEL  POC URINE PREG, ED   ____________________________________________   ED ECG REPORT I, Vanessa Lincolnia, the attending physician, personally viewed and interpreted this ECG.  Normal sinus rate of 81, no ST elevation, T wave version lead III, normal intervals ____________________________________________  RADIOLOGY   Official radiology report(s): CT HEAD WO CONTRAST  Result Date: 10/20/2020 CLINICAL DATA:  Left-sided arm and leg numbness intermittently for several months EXAM: CT HEAD WITHOUT CONTRAST TECHNIQUE: Contiguous axial images were obtained from the base of the skull through the vertex without intravenous contrast. COMPARISON:  None. FINDINGS: Brain: No evidence of acute infarction, hemorrhage, hydrocephalus, extra-axial collection or mass lesion/mass effect. Vascular: No hyperdense vessel or unexpected calcification. Skull: Normal. Negative for fracture or focal lesion. Sinuses/Orbits: No  acute finding. Other: None. IMPRESSION: No acute intracranial pathology. Electronically Signed   By: Eddie Candle M.D.   On: 10/20/2020 10:55   MR Brain W and Wo Contrast  Result Date: 10/20/2020 CLINICAL DATA:  Neuro deficit, acute stroke suspected. EXAM: MRI HEAD WITHOUT AND WITH CONTRAST TECHNIQUE: Multiplanar, multiecho pulse sequences of the brain and surrounding structures were obtained without and with intravenous contrast. CONTRAST:  57mL GADAVIST GADOBUTROL 1 MMOL/ML IV SOLN COMPARISON:  Same day head CT. FINDINGS: Brain: No acute infarction, hemorrhage, hydrocephalus, extra-axial collection or mass lesion. No abnormal enhancement. Vascular: Major arterial flow voids are maintained at the skull base. Skull and upper cervical spine: Normal marrow signal. Sinuses/Orbits: Large retention cysts in the right maxillary sinus. Other: No sizable mastoid effusions. IMPRESSION: No evidence of acute intracranial abnormality. Specifically, no acute infarct. Electronically Signed   By: Margaretha Sheffield MD   On: 10/20/2020 13:09    ____________________________________________   PROCEDURES  Procedure(s) performed (including Critical Care):  Procedures   ____________________________________________   INITIAL IMPRESSION / ASSESSMENT AND PLAN / ED COURSE  Alexa Rosales was evaluated in Emergency Department on 10/20/2020 for the symptoms described in the history of present illness. She was evaluated in the context of the global COVID-19 pandemic, which necessitated consideration that the patient might be at risk for infection with the SARS-CoV-2 virus that causes COVID-19. Institutional protocols and algorithms that pertain to the evaluation of patients at risk for COVID-19 are in a state of rapid change based on information released by regulatory bodies including the CDC and federal and state organizations. These policies and algorithms were followed during the patient's care in the ED.    Patient is a  well-appearing 35 year old who comes in with some left-sided numbness that has now since mostly are all resolved.  I have very low suspicion for stroke but I given her age I would consider more so than like MS.  Patient denies any neck trauma, has full range of motion of neck.  Unlikely dissection.  Discussed with patient labs are ordered to evaluate for Electra abnormalities, hyperglycemia.  These were all normal.  CT head no evidence of mass.  Will get MRI to rule out MS, stroke  MRI was reassuring.  Discussed with patient she can follow-up with neurology if she continues to have the symptoms otherwise that could be related to headaches and I recommend trying to treat her headache if it comes on with a headache.  She expressed understanding felt comfortable with discharge home  I discussed the provisional nature of ED diagnosis, the treatment so far, the ongoing plan of care, follow up appointments and return precautions with the patient and any family or support people present. They expressed understanding and agreed with the plan, discharged home.          ____________________________________________   FINAL CLINICAL IMPRESSION(S) / ED DIAGNOSES   Final diagnoses:  Numbness  Intractable headache, unspecified chronicity pattern, unspecified headache type      MEDICATIONS GIVEN DURING THIS VISIT:  Medications  gadobutrol (GADAVIST) 1 MMOL/ML injection 9 mL (9 mLs Intravenous Contrast Given 10/20/20 1253)     ED Discharge Orders     None        Note:  This document was prepared using Dragon voice recognition software and may include unintentional dictation errors.    Vanessa Schuyler, MD 10/20/20 [phone]

## 2020-10-20 NOTE — Discharge Instructions (Addendum)
Your MRI and labs are reassuring but you can follow-up with neurologist if this continues to happen.  Return to the ER if you develop worsening symptoms or any other concern  IMPRESSION: No evidence of acute intracranial abnormality. Specifically, no acute infarct.

## 2020-10-20 NOTE — Telephone Encounter (Signed)
[facility] - Client TELEPHONE ADVICE RECORD [facility] Patient Name: Alexa Rosales Gender: Female DOB: 08-03-1985 Age: 35 Y 3 M 26 D Return Phone Number: [phone] (Primary) Address: City/ State/ Zip: [place] [address] Client [facility] - Client Client Site [facility] - Day Physician Alma Friendly - NP Contact Type Call Who Is Calling Patient / Member / Family / Caregiver Call Type Triage / Clinical Relationship To Patient Self Return Phone Number [phone] (Primary) Chief Complaint NUMBNESS/TINGLING- sudden on one side of the body or face Reason for Call Symptomatic / Request for Alexa Rosales with office states caller is c/o headache effecting vision and numbness and tingling on the left side [facility] Not Listed Alexa Rosales Translation No Nurse Assessment Nurse: Alexa Rover, RN, Alexa Rosales Date/Time (Eastern Time): 10/20/2020 9:25:33 AM Confirm and document reason for call. If symptomatic, describe symptoms. ---Alexa Rosales with office states caller is c/o headache effecting vision and numbness and tingling on the left side/ left arm and hand and leg and foot. Intermittent. Started a month ago and not currently happening occurred 2 weeks ago was the last occurrence. States vision and headache not current either. When she gets the headache she gets the pressure behind her eyes. The last headache and vision disturbance was a month ago. Does the patient have any new or worsening symptoms? ---Yes Will a triage be completed? ---Yes Related visit to physician within the last 2 weeks? ---No Does the PT have any chronic conditions? (i.e. diabetes, asthma, this includes High risk factors for pregnancy, etc.) ---No Is the patient pregnant or possibly pregnant? (Ask all females between the ages of 28-55) ---No Is this a behavioral health or substance abuse  call? ---No PLEASE NOTE: All timestamps contained within this report are represented as [place] Standard Time. CONFIDENTIALTY NOTICE: This fax transmission is intended only for the addressee. It contains information that is legally privileged, confidential or otherwise protected from use or disclosure. If you are not the intended recipient, you are strictly prohibited from reviewing, disclosing, copying using or disseminating any of this information or taking any action in reliance on or regarding this information. If you have received this fax in error, please notify us immediately by telephone so that we can arrange for its return to [place]. Phone: [phone], Toll-Free: [phone], Fax: [phone] Page: 2 of 2 Call Id: [id number] Guidelines Guideline Title Affirmed Question Affirmed Notes Nurse Date/Time Alexa Rosales Time) Neurologic Deficit [1] Weakness (i.e., paralysis, loss of muscle strength) of the face, arm / hand, or leg / foot on one side of the body AND [2] sudden onset AND [3] brief (now gone) Alexa Rosales, Alexa Rosales, [facility] 10/20/2020 9:29:52 AM Disp. Time Alexa Rosales Time) Disposition Final User 10/20/2020 9:22:25 AM Send to Urgent Alexa Rosales, Alexa Rosales 10/20/2020 9:34:48 AM Go to ED Now (or PCP triage) Yes Alexa Rover, RN, Herbert Rosales Disagree/Comply Comply Caller Understands Yes PreDisposition Did not know what to do Care Advice Given Per Guideline GO TO ED NOW (OR PCP TRIAGE): ANOTHER ADULT SHOULD DRIVE: CARE ADVICE given per Neurologic Deficit (Adult) guideline. Comments User: Alexa Singer, RN Date/Time Alexa Rosales Time): 10/20/2020 9:29:30 AM Was just wanting an appt to get everything checked out but nothing currently going on. User: Alexa Singer, RN Date/Time Alexa Rosales Time): 10/20/2020 9:38:58 AM Last sudden onset of symptoms were two weeks ago. It was sudden and now gone. Still recommended ER per directives may need CT scan for evaluation. Advised if it  hits again for future purposes to go right  then to the ER and note the time of symptoms and rationale. Caller verified understanding. Referrals GO TO FACILITY OTHER - [facility]

## 2020-10-21 NOTE — Telephone Encounter (Signed)
Noted, will evaluate. 

## 2020-10-22 ENCOUNTER — Ambulatory Visit: Payer: 59 | Admitting: Primary Care

## 2020-11-03 ENCOUNTER — Ambulatory Visit (INDEPENDENT_AMBULATORY_CARE_PROVIDER_SITE_OTHER): Payer: 59 | Admitting: Family Medicine

## 2020-11-03 ENCOUNTER — Other Ambulatory Visit: Payer: Self-pay

## 2020-11-03 ENCOUNTER — Encounter: Payer: Self-pay | Admitting: Family Medicine

## 2020-11-03 VITALS — BP 118/84 | HR 77 | Temp 98.2°F | Ht 64.0 in | Wt 195.0 lb

## 2020-11-03 DIAGNOSIS — N898 Other specified noninflammatory disorders of vagina: Secondary | ICD-10-CM | POA: Diagnosis not present

## 2020-11-03 DIAGNOSIS — Z1159 Encounter for screening for other viral diseases: Secondary | ICD-10-CM

## 2020-11-03 DIAGNOSIS — Z113 Encounter for screening for infections with a predominantly sexual mode of transmission: Secondary | ICD-10-CM

## 2020-11-03 DIAGNOSIS — Z114 Encounter for screening for human immunodeficiency virus [HIV]: Secondary | ICD-10-CM | POA: Diagnosis not present

## 2020-11-03 DIAGNOSIS — N926 Irregular menstruation, unspecified: Secondary | ICD-10-CM | POA: Diagnosis not present

## 2020-11-03 LAB — POCT URINE PREGNANCY: Preg Test, Ur: NEGATIVE

## 2020-11-03 MED ORDER — METRONIDAZOLE 500 MG PO TABS: 500.0000 mg | ORAL_TABLET | Freq: Two times a day (BID) | ORAL | 0 refills | Status: AC

## 2020-11-03 NOTE — Assessment & Plan Note (Signed)
Pt with several prior BV episodes which respond to abx. New symptoms. Will test, treatment sent in based on prior episodes will advise stopping if negative

## 2020-11-03 NOTE — Patient Instructions (Signed)
Will get results

## 2020-11-03 NOTE — Assessment & Plan Note (Signed)
2 months since last cycle. Pt notes hx of negative urine pregnancy tests with prior pregnancy. This is an undesired pregnancy if she is pregnant so will proceed with blood test to rule-out given hx. Advised if negative to return if no period in 1 month to consider additional work-up.

## 2020-11-03 NOTE — Progress Notes (Signed)
Subjective:     Alexa Rosales is a 35 y.o. female presenting for pregnancy testing     HPI  #Missed period - last cycle was 2 months ago - had negative urine - does get monthly cycles - the longest she has gone was 3 months w/o a cycle - has not taken her OCPs for 6 months - did have regular cycles coming off this - endorses occasional HA and dizziness, some nausea - but short lived - unprotected intercourse 3 weeks ago - condom broke   #Vaginal discharge - with odor - hx of recurrent bv   #Headache - was triaged and told to go to the ER - still has intermittent HA - imaging all reassuring - does get some numbness   Review of Systems   Social History   Tobacco Use  Smoking Status Never  Smokeless Tobacco Never        Objective:    BP Readings from Last 3 Encounters:  11/03/20 118/84  10/20/20 126/81  06/17/20 136/79   Wt Readings from Last 3 Encounters:  11/03/20 195 lb (88.5 kg)  10/20/20 198 lb (89.8 kg)  06/17/20 198 lb (89.8 kg)    BP 118/84   Pulse 77   Temp 98.2 F (36.8 C) (Temporal)   Ht 5\' 4"  (1.626 m)   Wt 195 lb (88.5 kg)   SpO2 98%   BMI 33.47 kg/m    Physical Exam Constitutional:      General: She is not in acute distress.    Appearance: She is well-developed. She is not diaphoretic.  HENT:     Right Ear: External ear normal.     Left Ear: External ear normal.  Eyes:     Conjunctiva/sclera: Conjunctivae normal.  Cardiovascular:     Rate and Rhythm: Normal rate.  Pulmonary:     Effort: Pulmonary effort is normal.  Musculoskeletal:     Cervical back: Neck supple.  Skin:    General: Skin is warm and dry.     Capillary Refill: Capillary refill takes less than 2 seconds.  Neurological:     Mental Status: She is alert. Mental status is at baseline.  Psychiatric:        Mood and Affect: Mood normal.        Behavior: Behavior normal.    Urine pregnancy: negative      Assessment & Plan:   Problem List Items  Addressed This Visit       Other   Missed period - Primary    2 months since last cycle. Pt notes hx of negative urine pregnancy tests with prior pregnancy. This is an undesired pregnancy if she is pregnant so will proceed with blood test to rule-out given hx. Advised if negative to return if no period in 1 month to consider additional work-up.        Relevant Orders   POCT urine pregnancy   hCG, quantitative, pregnancy   Vaginal discharge    Pt with several prior BV episodes which respond to abx. New symptoms. Will test, treatment sent in based on prior episodes will advise stopping if negative       Relevant Medications   metroNIDAZOLE (FLAGYL) 500 MG tablet   Other Relevant Orders   WET PREP BY MOLECULAR PROBE   Other Visit Diagnoses     Need for hepatitis C screening test       Relevant Orders   Hepatitis C antibody   Screening for HIV (human immunodeficiency  virus)       Relevant Medications   metroNIDAZOLE (FLAGYL) 500 MG tablet   Other Relevant Orders   HIV Antibody (routine testing w rflx)   Routine screening for STI (sexually transmitted infection)       Relevant Orders   C. trachomatis/N. gonorrhoeae RNA   RPR        Return in about 4 weeks (around 12/01/2020), or if symptoms worsen or fail to improve.  Lesleigh Noe, MD  This visit occurred during the SARS-CoV-2 public health emergency.  Safety protocols were in place, including screening questions prior to the visit, additional usage of staff PPE, and extensive cleaning of exam room while observing appropriate contact time as indicated for disinfecting solutions.

## 2020-11-04 ENCOUNTER — Encounter: Payer: Self-pay | Admitting: Family Medicine

## 2020-11-04 LAB — RPR: RPR Ser Ql: NONREACTIVE

## 2020-11-04 LAB — HEPATITIS C ANTIBODY
Hepatitis C Ab: NONREACTIVE
SIGNAL TO CUT-OFF: 0.02 (ref <1.00)

## 2020-11-04 LAB — WET PREP BY MOLECULAR PROBE
Candida species: NOT DETECTED
MICRO NUMBER:: 12136437
SPECIMEN QUALITY:: ADEQUATE
Trichomonas vaginosis: NOT DETECTED

## 2020-11-04 LAB — HCG, QUANTITATIVE, PREGNANCY: Quantitative HCG: <0.6 m[IU]/mL

## 2020-11-04 LAB — HIV ANTIBODY (ROUTINE TESTING W REFLEX): HIV 1&2 Ab, 4th Generation: NONREACTIVE

## 2020-11-29 ENCOUNTER — Ambulatory Visit
Admission: EM | Admit: 2020-11-29 | Discharge: 2020-11-29 | Disposition: A | Discharge status: Home / Self Care | Payer: 59 | Attending: Emergency Medicine | Admitting: Emergency Medicine

## 2020-11-29 ENCOUNTER — Other Ambulatory Visit: Payer: Self-pay

## 2020-11-29 DIAGNOSIS — N898 Other specified noninflammatory disorders of vagina: Secondary | ICD-10-CM | POA: Diagnosis present

## 2020-11-29 DIAGNOSIS — R300 Dysuria: Secondary | ICD-10-CM

## 2020-11-29 LAB — POCT URINALYSIS DIP (DEVICE)
Bilirubin Urine: NEGATIVE
Glucose, UA: NEGATIVE mg/dL
Hgb urine dipstick: NEGATIVE
Ketones, ur: NEGATIVE mg/dL
Nitrite: NEGATIVE
Protein, ur: NEGATIVE mg/dL
Specific Gravity, Urine: >=1.03 (ref 1.005–1.030)
Urobilinogen, UA: 0.2 mg/dL (ref 0.0–1.0)
pH: 5.5 (ref 5.0–8.0)

## 2020-11-29 MED ORDER — PHENAZOPYRIDINE HCL 200 MG PO TABS: 200.0000 mg | ORAL_TABLET | Freq: Three times a day (TID) | ORAL | 0 refills | Status: DC

## 2020-11-29 MED ORDER — NITROFURANTOIN MONOHYD MACRO 100 MG PO CAPS: 100.0000 mg | ORAL_CAPSULE | Freq: Two times a day (BID) | ORAL | 0 refills | Status: DC

## 2020-11-29 NOTE — ED Provider Notes (Signed)
[facility]    CSN: [id number] Arrival date & time: 11/29/20  [id number]      History   Chief Complaint Chief Complaint  Patient presents with   Dysuria    HPI Alexa Rosales is a 35 y.o. female.   HPI  35 year old female here for evaluation of urinary complaints.  Patient reports that she has been experiencing cloudy urine for the last 2 weeks.  She states that she pushed H2O and cranberry juice which helped alleviate some of the cloudiness but did not resolve it.  In the last week she has developed bladder pressure and pain at the end of urination.  In the last 2 days she has had urinary urgency and frequency.  She is also experiencing a vaginal discharge with a fishy odor.  She was recently treated for BV with oral Flagyl 1 week ago but states that her symptoms did not resolve.  She took oral Flagyl this time but she typically is treated with MetroGel.  She states she has not had any blood in her urine, fever, low back pain, nausea, or vomiting.  Patient also denies abdominal pain.  Past Medical History:  Diagnosis Date   History of anemia     Patient Active Problem List   Diagnosis Date Noted   Missed period 11/03/2020   Vaginal discharge 11/03/2020   Chronic rhinitis 07/23/2018   Abnormal uterine bleeding (AUB) 12/29/2015   Family history of breast cancer in female 11/15/2011   Allergic rhinitis 08/25/2011   Genital herpes 08/25/2011    Past Surgical History:  Procedure Laterality Date   CESAREAN SECTION     HERNIA REPAIR     WISDOM TOOTH EXTRACTION      OB History   No obstetric history on file.      Home Medications    Prior to Admission medications   Medication Sig Start Date End Date Taking? Authorizing Provider  cetirizine (ZYRTEC) 5 MG tablet Take 5 mg by mouth daily.   Yes [provider]  fluticasone (FLONASE) 50 MCG/ACT nasal spray Place 1-2 sprays into both nostrils daily as needed for allergies or rhinitis.   Yes [provider]  nitrofurantoin, macrocrystal-monohydrate, (MACROBID) 100 MG capsule Take 1 capsule (100 mg total) by mouth 2 (two) times daily. 11/29/20  Yes Margarette Canada, NP  phenazopyridine (PYRIDIUM) 200 MG tablet Take 1 tablet (200 mg total) by mouth 3 (three) times daily. 11/29/20  Yes Margarette Canada, NP  ferrous sulfate 325 (65 FE) MG tablet Take 325 mg by mouth daily with breakfast.  02/15/20  [provider]    Family History Family History  Problem Relation Age of Onset   Diabetes Mother    Hypertension Mother    Sarcoidosis Father    Asthma Sister    Colon cancer Maternal Grandmother    Breast cancer Paternal Grandmother    Breast cancer Maternal Aunt        great MAT    Social History Social History   Tobacco Use   Smoking status: Never   Smokeless tobacco: Never  Vaping Use   Vaping Use: Never used  Substance Use Topics   Alcohol use: Yes    Comment: occassional   Drug use: No     Allergies   Sulfa antibiotics   Review of Systems Review of Systems  Constitutional:  Negative for activity change, appetite change and fever.  Gastrointestinal:  Negative for abdominal pain, diarrhea, nausea and vomiting.  Genitourinary:  Positive for  dysuria, frequency, urgency and vaginal discharge. Negative for hematuria.  Musculoskeletal:  Negative for back pain.  Hematological: Negative.   Psychiatric/Behavioral: Negative.      Physical Exam Triage Vital Signs ED Triage Vitals  Enc Vitals Group     BP 11/29/20 0832 131/83     Pulse Rate 11/29/20 0832 79     Resp 11/29/20 0832 18     Temp 11/29/20 0832 98 F (36.7 C)     Temp Source 11/29/20 0832 Oral     SpO2 11/29/20 0832 100 %     Weight 11/29/20 0830 198 lb (89.8 kg)     Height 11/29/20 0830 '5\' 6"'$  (1.676 m)     Head Circumference --      Peak Flow --      Pain Score 11/29/20 0830 8     Pain Loc --      Pain Edu? --      Excl. in [place]? --    No data found.  Updated Vital Signs BP 131/83 (BP  Location: Left Arm)   Pulse 79   Temp 98 F (36.7 C) (Oral)   Resp 18   Ht '5\' 6"'$  (1.676 m)   Wt 198 lb (89.8 kg)   LMP 09/17/2020 (Approximate)   SpO2 100%   BMI 31.96 kg/m   Visual Acuity Right Eye Distance:   Left Eye Distance:   Bilateral Distance:    Right Eye Near:   Left Eye Near:    Bilateral Near:     Physical Exam Vitals and nursing note reviewed.  Constitutional:      General: She is not in acute distress.    Appearance: Normal appearance. She is not ill-appearing.  HENT:     Head: Normocephalic and atraumatic.  Cardiovascular:     Rate and Rhythm: Normal rate and regular rhythm.     Pulses: Normal pulses.     Heart sounds: Normal heart sounds. No murmur heard.   No gallop.  Pulmonary:     Effort: Pulmonary effort is normal.     Breath sounds: Normal breath sounds. No wheezing, rhonchi or rales.  Abdominal:     General: Abdomen is flat. Bowel sounds are normal.     Palpations: Abdomen is soft.     Tenderness: There is no abdominal tenderness. There is no right CVA tenderness, left CVA tenderness, guarding or rebound.  Skin:    General: Skin is warm and dry.     Capillary Refill: Capillary refill takes less than 2 seconds.     Findings: No erythema or rash.  Neurological:     General: No focal deficit present.     Mental Status: She is alert and oriented to person, place, and time.  Psychiatric:        Mood and Affect: Mood normal.        Behavior: Behavior normal.        Thought Content: Thought content normal.        Judgment: Judgment normal.     UC Treatments / Results  Labs (all labs ordered are listed, but only abnormal results are displayed) Labs Reviewed  POCT URINALYSIS DIP (DEVICE) - Abnormal; Notable for the following components:      Result Value   Leukocytes,Ua TRACE (*)    All other components within normal limits  URINE CULTURE  POCT URINALYSIS DIPSTICK, ED / UC  CERVICOVAGINAL ANCILLARY ONLY    EKG   Radiology No results  found.  Procedures Procedures (including  critical care time)  Medications Ordered in UC Medications - No data to display  Initial Impression / Assessment and Plan / UC Course  I have reviewed the triage vital signs and the nursing notes.  Pertinent labs & imaging results that were available during my care of the patient were reviewed by me and considered in my medical decision making (see chart for details).  Patient is a very pleasant, nontoxic-appearing 35 year old female here for evaluation of urinary complaints have been going on for the past week.  She is also having a vaginal discharge with a fishy odor.  She was recently treated for BV, which she states is recurrent BV, with oral Flagyl and she states her symptoms do not resolve.  Patient's physical exam reveals a benign cardiopulmonary exam with clear lung sounds in all fields.  No CVA tenderness on exam.  Abdomen is soft, flat, nontender, with positive bowel sounds in all 4 quadrants.  Urinalysis collected at triage that shows trace leukocytes but no nitrates, blood, or protein.  Also no glucose.  We will send urine for culture.  As patient is still having vaginal discharge will send Aptima swab to look for the presence of BV, yeast, trichomoniasis.  We will treat patient in the interim with Macrobid twice daily for 5 days and Pyridium to with urinary discomfort.  Urine will be sent for culture.   Final Clinical Impressions(s) / UC Diagnoses   Final diagnoses:  Dysuria  Vaginal discharge     Discharge Instructions      Take the Macrobid twice daily for 5 days with food for treatment of urinary tract infection.  Use the Pyridium every 8 hours as needed for urinary discomfort.  This will turn your urine a bright red-orange.  Increase your oral fluid intake so that you increase your urine production and or flushing your urinary system.  Take an over-the-counter probiotic, such as Culturelle-Align-Activia, 1 hour after each dose  of antibiotic to prevent diarrhea or yeast infections from forming.  We will culture urine and change the antibiotics if necessary.  Return for reevaluation, or see your primary care provider, for any new or worsening symptoms.      ED Prescriptions     Medication Sig Dispense Auth. Provider   nitrofurantoin, macrocrystal-monohydrate, (MACROBID) 100 MG capsule Take 1 capsule (100 mg total) by mouth 2 (two) times daily. 10 capsule Margarette Canada, NP   phenazopyridine (PYRIDIUM) 200 MG tablet Take 1 tablet (200 mg total) by mouth 3 (three) times daily. 6 tablet Margarette Canada, NP      PDMP not reviewed this encounter.   Margarette Canada, NP 11/29/20 [phone]

## 2020-11-29 NOTE — ED Triage Notes (Signed)
Pt c/o cloudy urine for over a week, painful urination. Pt denies hematuria, f/n/v/d or other symptoms.

## 2020-11-29 NOTE — Discharge Instructions (Addendum)

## 2020-11-30 LAB — CERVICOVAGINAL ANCILLARY ONLY
Bacterial Vaginitis (gardnerella): POSITIVE — AB
Candida Glabrata: NEGATIVE
Candida Vaginitis: NEGATIVE
Chlamydia: NEGATIVE
Comment: NEGATIVE
Comment: NEGATIVE
Comment: NEGATIVE
Comment: NEGATIVE
Comment: NEGATIVE
Comment: NORMAL
Neisseria Gonorrhea: NEGATIVE
Trichomonas: NEGATIVE

## 2020-12-01 LAB — URINE CULTURE: Culture: 70000 — AB

## 2020-12-03 ENCOUNTER — Telehealth (HOSPITAL_COMMUNITY): Payer: Self-pay | Admitting: Emergency Medicine

## 2020-12-03 MED ORDER — METRONIDAZOLE 500 MG PO TABS: 500.0000 mg | ORAL_TABLET | Freq: Two times a day (BID) | ORAL | 0 refills | Status: DC

## 2021-02-18 ENCOUNTER — Other Ambulatory Visit: Payer: Self-pay

## 2021-02-18 ENCOUNTER — Ambulatory Visit
Admission: EM | Admit: 2021-02-18 | Discharge: 2021-02-18 | Disposition: A | Discharge status: Home / Self Care | Payer: Medicaid Other

## 2021-02-18 ENCOUNTER — Encounter: Payer: Self-pay | Admitting: Emergency Medicine

## 2021-02-18 DIAGNOSIS — O219 Vomiting of pregnancy, unspecified: Secondary | ICD-10-CM

## 2021-02-18 DIAGNOSIS — B370 Candidal stomatitis: Secondary | ICD-10-CM | POA: Diagnosis not present

## 2021-02-18 MED ORDER — ONDANSETRON HCL 4 MG PO TABS: 4.0000 mg | ORAL_TABLET | Freq: Four times a day (QID) | ORAL | 0 refills | Status: DC

## 2021-02-18 MED ORDER — CLOTRIMAZOLE 10 MG MT TROC: 10.0000 mg | Freq: Every day | OROMUCOSAL | 0 refills | Status: AC

## 2021-02-18 MED ORDER — METOCLOPRAMIDE HCL 10 MG PO TABS: 10.0000 mg | ORAL_TABLET | Freq: Four times a day (QID) | ORAL | 0 refills | Status: DC

## 2021-02-18 NOTE — ED Triage Notes (Signed)
Pt presents today with c/o of "white patches in mouth" x 2 days. Denies fever. She is also [redacted] weeks pregnant and c/o intermittent nausea.

## 2021-02-18 NOTE — Discharge Instructions (Addendum)
Take thrush and allow to dissolve in mouth 5 times a day for 7 days  Follow up with urgent care or obstetrician for persistent symptoms  May use reglan every 6 hours for nausea  Follow up with urgent care or obstetrician for persistent symptoms

## 2021-02-18 NOTE — ED Provider Notes (Signed)
[facility]    CSN: [id number] Arrival date & time: 02/18/21  0843      History   Chief Complaint Chief Complaint  Patient presents with   Mouth Lesions   Nausea   pregnant [redacted] wks    HPI Alexa Rosales is a 35 y.o. female.   Patient presents with Alexa Rosales coating over tongue and Alexa Rosales patches throughout lower gumline for 1 day.  Noticed some soreness while eating yesterday, scrubbed Alexa Rosales coating from tongue with toothbrush which caused irritation and bleeding.  Later that day noticed the Alexa Rosales patches over gumline and came in for evaluation.  Denies fever, chills, difficulty swallowing, dental decay, bleeding gums.  History of anemia.  Currently [redacted] weeks pregnant.  Patient also endorses nausea without vomiting occurring throughout the daytime and nighttime.  Attempted use of Diclegis which caused headaches.  Requesting new medication.  Past Medical History:  Diagnosis Date   History of anemia     Patient Active Problem List   Diagnosis Date Noted   Missed period 11/03/2020   Vaginal discharge 11/03/2020   Chronic rhinitis 07/23/2018   Abnormal uterine bleeding (AUB) 12/29/2015   Family history of breast cancer in female 11/15/2011   Allergic rhinitis 08/25/2011   Genital herpes 08/25/2011    Past Surgical History:  Procedure Laterality Date   CESAREAN SECTION     HERNIA REPAIR     WISDOM TOOTH EXTRACTION      OB History     Gravida  1   Para      Term      Preterm      AB      Living         SAB      IAB      Ectopic      Multiple      Live Births           Obstetric Comments  Due Date September 20, 2021          Home Medications    Prior to Admission medications   Medication Sig Start Date End Date Taking? Authorizing Provider  clotrimazole (MYCELEX) 10 MG troche Take 1 tablet (10 mg total) by mouth 5 (five) times daily for 7 days. 02/18/21 02/25/21 Yes Leland Staszewski, Leitha Schuller, NP  metoCLOPramide (REGLAN) 10 MG tablet Take 1  tablet (10 mg total) by mouth every 6 (six) hours for 3 days. 02/18/21 02/21/21 Yes Amonie Wisser R, NP  cetirizine (ZYRTEC) 5 MG tablet Take 5 mg by mouth daily.    [provider]  fluticasone (FLONASE) 50 MCG/ACT nasal spray Place 1-2 sprays into both nostrils daily as needed for allergies or rhinitis.    [provider]  Prenatal 28-0.8 MG TABS Take 1 tablet by mouth daily.    [provider]  ferrous sulfate 325 (65 FE) MG tablet Take 325 mg by mouth daily with breakfast.  02/15/20  [provider]    Family History Family History  Problem Relation Age of Onset   Diabetes Mother    Hypertension Mother    Sarcoidosis Father    Asthma Sister    Colon cancer Maternal Grandmother    Breast cancer Paternal Grandmother    Breast cancer Maternal Aunt        great MAT    Social History Social History   Tobacco Use   Smoking status: Never   Smokeless tobacco: Never  Vaping Use   Vaping Use: Never used  Substance  Use Topics   Alcohol use: Yes    Comment: occassional   Drug use: No     Allergies   Sulfa antibiotics   Review of Systems Review of Systems  Constitutional: Negative.   HENT:  Positive for mouth sores. Negative for congestion, dental problem, drooling, ear discharge, ear pain, facial swelling, hearing loss, nosebleeds, postnasal drip, rhinorrhea, sinus pressure, sinus pain, sneezing, sore throat, tinnitus and trouble swallowing.   Respiratory: Negative.    Cardiovascular: Negative.   Gastrointestinal:  Positive for vomiting. Negative for abdominal pain, anal bleeding, blood in stool, constipation, diarrhea, nausea and rectal pain.  Skin: Negative.   Neurological: Negative.     Physical Exam Triage Vital Signs ED Triage Vitals  Enc Vitals Group     BP 02/18/21 0935 132/89     Pulse Rate 02/18/21 0935 98     Resp 02/18/21 0935 18     Temp 02/18/21 0935 98.2 F (36.8 C)     Temp Source 02/18/21 0935 Oral     SpO2  02/18/21 0935 99 %     Weight --      Height --      Head Circumference --      Peak Flow --      Pain Score 02/18/21 0932 0     Pain Loc --      Pain Edu? --      Excl. in [place]? --    No data found.  Updated Vital Signs BP 132/89 (BP Location: Left Arm)   Pulse 98   Temp 98.2 F (36.8 C) (Oral)   Resp 18   LMP  (LMP Unknown)   SpO2 99%   Visual Acuity Right Eye Distance:   Left Eye Distance:   Bilateral Distance:    Right Eye Near:   Left Eye Near:    Bilateral Near:     Physical Exam Constitutional:      Appearance: Normal appearance. She is normal weight.  HENT:     Mouth/Throat:     Comments: Streaky Alexa Rosales patches spread diffusely over tongue, no patches seen on gumline, no dental decay, no erythema or swelling noted Eyes:     Extraocular Movements: Extraocular movements intact.  Pulmonary:     Effort: Pulmonary effort is normal.  Neurological:     Mental Status: She is alert.     UC Treatments / Results  Labs (all labs ordered are listed, but only abnormal results are displayed) Labs Reviewed - No data to display  EKG   Radiology No results found.  Procedures Procedures (including critical care time)  Medications Ordered in UC Medications - No data to display  Initial Impression / Assessment and Plan / UC Course  I have reviewed the triage vital signs and the nursing notes.  Pertinent labs & imaging results that were available during my care of the patient were reviewed by me and considered in my medical decision making (see chart for details).  Oral thrush Vomiting or nausea pregnancy  1.  Clotrimazole 10 mg troches 5 times daily for 7 days 2.  Reglan 10 mg every 6 hours as needed, prescribed medication for 3 days, recommended follow-up with obstetrician for further management Final Clinical Impressions(s) / UC Diagnoses   Final diagnoses:  Oral thrush  Vomiting or nausea of pregnancy     Discharge Instructions      Take thrush  and allow to dissolve in mouth 5 times a day for 7 days  Follow up  with urgent care or obstetrician for persistent symptoms  May use reglan every 6 hours for nausea  Follow up with urgent care or obstetrician for persistent symptoms     ED Prescriptions     Medication Sig Dispense Auth. Provider   clotrimazole (MYCELEX) 10 MG troche Take 1 tablet (10 mg total) by mouth 5 (five) times daily for 7 days. 35 tablet Paxton Kanaan R, NP   ondansetron (ZOFRAN) 4 MG tablet  (Status: Discontinued) Take 1 tablet (4 mg total) by mouth every 6 (six) hours. 36 tablet Topacio Cella R, NP   metoCLOPramide (REGLAN) 10 MG tablet Take 1 tablet (10 mg total) by mouth every 6 (six) hours for 3 days. 12 tablet Ciarra Braddy, Leitha Schuller, NP      PDMP not reviewed this encounter.   Hans Eden, NP 02/18/21 1018

## 2021-03-05 LAB — OB RESULTS CONSOLE GC/CHLAMYDIA
Chlamydia: NEGATIVE
Gonorrhea: NEGATIVE

## 2021-03-05 LAB — OB RESULTS CONSOLE RPR: RPR: NONREACTIVE

## 2021-03-05 LAB — OB RESULTS CONSOLE VARICELLA ZOSTER ANTIBODY, IGG: Varicella: IMMUNE

## 2021-03-05 LAB — OB RESULTS CONSOLE HEPATITIS B SURFACE ANTIGEN: Hepatitis B Surface Ag: NEGATIVE

## 2021-03-05 LAB — OB RESULTS CONSOLE HIV ANTIBODY (ROUTINE TESTING): HIV: NONREACTIVE

## 2021-03-05 LAB — OB RESULTS CONSOLE RUBELLA ANTIBODY, IGM: Rubella: IMMUNE

## 2021-04-13 ENCOUNTER — Other Ambulatory Visit: Payer: Self-pay | Admitting: Certified Nurse Midwife

## 2021-04-13 DIAGNOSIS — O24410 Gestational diabetes mellitus in pregnancy, diet controlled: Secondary | ICD-10-CM

## 2021-04-15 ENCOUNTER — Encounter: Payer: Medicaid Other | Attending: Obstetrics | Admitting: *Deleted

## 2021-04-15 ENCOUNTER — Other Ambulatory Visit: Payer: Self-pay

## 2021-04-15 ENCOUNTER — Encounter: Payer: Self-pay | Admitting: *Deleted

## 2021-04-15 VITALS — BP 118/70 | Ht 65.0 in | Wt 209.5 lb

## 2021-04-15 DIAGNOSIS — O24111 Pre-existing diabetes mellitus, type 2, in pregnancy, first trimester: Secondary | ICD-10-CM | POA: Diagnosis present

## 2021-04-15 DIAGNOSIS — Z3A00 Weeks of gestation of pregnancy not specified: Secondary | ICD-10-CM | POA: Diagnosis not present

## 2021-04-15 DIAGNOSIS — O24112 Pre-existing diabetes mellitus, type 2, in pregnancy, second trimester: Secondary | ICD-10-CM

## 2021-04-15 NOTE — Progress Notes (Signed)
Diabetes Self-Management Education  Visit Type: First/Initial  Appt. Start Time: 1320 Appt. End Time: 1430  04/15/2021  [name], identified by name and date of birth, is a 35 y.o. female with a diagnosis of Diabetes: Type 2 (Pregnant).   ASSESSMENT  Blood pressure 118/70, height 5\' 5"  (1.651 m), weight 209 lb 8 oz (95 kg), last menstrual period 12/07/2020, estimated date of delivery 09/20/2021 Body mass index is 34.86 kg/m.   Diabetes Self-Management Education - 04/15/21 1430       Visit Information   Visit Type First/Initial      Initial Visit   Diabetes Type Type 2   Pregnant   Are you currently following a meal plan? Yes    What type of meal plan do you follow? "trying to decrease carbs and sugars"    Are you taking your medications as prescribed? Yes    Date Diagnosed 1 month      Health Coping   How would you rate your overall health? Fair      Psychosocial Assessment   Patient Belief/Attitude about Diabetes Other (comment)   "sad and frustrated"   Self-care barriers None    Self-management support Doctor's office    Patient Concerns Nutrition/Meal planning;Medication;Monitoring;Healthy Lifestyle;Glycemic Control;Weight Control;Problem Solving    Special Needs None    Preferred Learning Style Auditory;Visual;Hands on    Alexa Rosales in progress    How often do you need to have someone help you when you read instructions, pamphlets, or other written materials from your doctor or pharmacy? 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 Needs Review    Patient understands incorporating nutritional management into lifestyle. Needs Instruction    Patient undertands incorporating physical activity into lifestyle. Needs Instruction    Patient understands using medications safely. Needs Instruction    Patient  understands monitoring blood glucose, interpreting and using results Needs Review    Patient understands prevention, detection, and treatment of acute complications. Needs Instruction    Patient understands prevention, detection, and treatment of chronic complications. Needs Instruction    Patient understands how to develop strategies to address psychosocial issues. Needs Instruction    Patient understands how to develop strategies to promote health/change behavior. Needs Instruction      Complications   Last HgB A1C per patient/outside source 7.9 %   03/05/2021   How often do you check your blood sugar? 3-4 times/day    Fasting Blood glucose range (mg/dL) --   Pt reports she hasn't done any fasting blood sugars   Postprandial Blood glucose range (mg/dL) 130-179;180-200;>200   She reports post meal readings of 137-280's mg/dL and reading of 172 mg/dL today. She reports not consistent on checking in last 3 weeks because she is moving and working. She also has 5 other children.   Have you had a dilated eye exam in the past 12 months? No    Have you had a dental exam in the past 12 months? No    Are you checking your feet? No      Dietary Intake   Breakfast works 3rd shift - eggs, [place] sausage and biscuit; eggs and [place] sausage; oatmeal; cereal and milk (almond unsweetened)    Snack (morning) reports 4-5 snacks/day - Kind bar, nuts, muffins, fruit (grapes, orange, kiwi, pineapple, watermelon), [place] yogurt  Lunch baked or grilled chicken; [place] sandwich on whole wheat bread; greens - kale, cabbage, collards; 1/2 cup pasta or rice with meat    Dinner beef, chicken, fish; potaotes, green beans, pinto beans, occasional corn, awparagus, brussel sprouts, broccoli, salads with meat    Beverage(s) water, juice and sweet tea 1-2 x week; diet soda, Crystal light      Exercise   Exercise Type ADL's      Patient Education   Previous Diabetes Education No   Nursing school   Disease state   Definition of diabetes, type 1 and 2, and the diagnosis of diabetes;Factors that contribute to the development of diabetes    Nutrition management  Role of diet in the treatment of diabetes and the relationship between the three main macronutrients and blood glucose level;Food label reading, portion sizes and measuring food.;Carbohydrate counting;Reviewed blood glucose goals for pre and post meals and how to evaluate the patients' food intake on their blood glucose level.    Physical activity and exercise  Role of exercise on diabetes management, blood pressure control and cardiac health.    Medications Other (comment)   Pt reports she was told that she will probably be on insulin.   Monitoring Taught/evaluated SMBG meter.;Purpose and frequency of SMBG.;Taught/discussed recording of test results and interpretation of SMBG.;Identified appropriate SMBG and/or A1C goals.;Ketone testing, when, how.   She has sent her BG records to OB.   Chronic complications Relationship between chronic complications and blood glucose control    Psychosocial adjustment Role of stress on diabetes;Identified and addressed patients feelings and concerns about diabetes    Preconception care Pregnancy and GDM  Role of pre-pregnancy blood glucose control on the development of the fetus;Reviewed with patient blood glucose goals with pregnancy;Role of family planning for patients with diabetes      Individualized Goals (developed by patient)   Reducing Risk Other (comment)   improve blood sugars, decrease medications, prevent diabetes complications, lose weight, lead a healthier lifestyle, become more fit     Outcomes   Expected Outcomes Demonstrated interest in learning. Expect positive outcomes        Individualized Plan for Diabetes Self-Management Training:   Learning Objective:  Patient will have a greater understanding of diabetes self-management. Patient education plan is to attend individual and/or group sessions per  assessed needs and concerns.   Plan:   Patient Instructions  Read booklet on Diabetes Management for Mothers-To-Be Follow Gestational Meal Planning Guidelines Allow 2-3 hours between meals and snacks Continue to avoid sugar sweetened drinks (juice, tea) Complete a 3 Day Food Record and bring to next appointment Check blood sugars 4 x day - before breakfast and 2 hrs after every meal and record  Bring blood sugar log to all appointments Call MD for prescription for meter strips and lancets Strips   Accu-Chek Guide  Lancets   Accu-Chek Softclix Purchase urine ketone strips if instructed by MD and check urine ketones every am:  If + increase bedtime snack to 1 protein and 2 carbohydrate servings Walk 20-30 minutes at least 5 x week if permitted by MD  Expected Outcomes:  Demonstrated interest in learning. Expect positive outcomes  Education material provided:  Gestational Booklet Gestational Meal Planning Guidelines Simple Meal Plan Meter = Accu-Chek Guide Me 3 Day Food Record Goals for a Healthy Pregnancy   If problems or questions, patient to contact team via:   Johny Drilling, RN, [place] [phone]  Future DSME appointment:  April 21, 2021  with the dietitian

## 2021-04-15 NOTE — Patient Instructions (Signed)
Read booklet on Diabetes Management for Mothers-To-Be Follow Gestational Meal Planning Guidelines Allow 2-3 hours between meals and snacks Continue to avoid sugar sweetened drinks (juice, tea) Complete a 3 Day Food Record and bring to next appointment Check blood sugars 4 x day - before breakfast and 2 hrs after every meal and record  Bring blood sugar log to all appointments Call MD for prescription for meter strips and lancets Strips   Accu-Chek Guide  Lancets   Accu-Chek Softclix Purchase urine ketone strips if instructed by MD and check urine ketones every am:  If + increase bedtime snack to 1 protein and 2 carbohydrate servings Walk 20-30 minutes at least 5 x week if permitted by MD

## 2021-04-18 NOTE — L&D Delivery Note (Signed)
Delivery Note ? ?First Stage: ?Labor onset: 1930 ?Augmentation : AROM @ 2206 ?Analgesia /Anesthesia intrapartum: none ?AROM at 2206 ? ?Second Stage: ?Complete dilation at 2207 ?Onset of pushing at 2207 ?FHR second stage Cat II, prolonged deceleration for 3 minutes immediately prior to delivery ? ?Rapid delivery of a viable female infant 08/27/2021 at 2212 by Alexa Rosales, CNM in one push by maternal effort. ?delivery of fetal head in OA position with no restitution. ?Loose nuchal cord;  True umbilical knot present. Anterior then posterior shoulders delivered easily without need for gentle downward traction. Baby placed on mom's chest, and attended to by peds.  ?Cord double clamped after cessation of pulsation, cut by FOB ?Cord blood sample collected  ? ?Third Stage: ?Placenta delivered Alexa Rosales intact with 3 VC @ 2220 ?Placenta disposition: discarded ?Uterine tone firm / bleeding scant ? ?"Skidmark" laceration identified on vaginal floor ?Anesthesia for repair: none needed ?Repair none needed ?Est. Blood Loss (mL): 51m ? ?Complications: spontaneous preterm birth, not enough time to treat GBS ? ?Mom to postpartum.  Baby to Nursery. ? ?Newborn: ?Birth Weight: TBD, infant taken to nursery  ?Apgar Scores: 8, 8 ?Feeding planned: breast ? ? ? ?

## 2021-04-21 ENCOUNTER — Encounter: Payer: Medicaid Other | Attending: Obstetrics | Admitting: Dietician

## 2021-04-21 ENCOUNTER — Encounter: Payer: Self-pay | Admitting: Dietician

## 2021-04-21 ENCOUNTER — Other Ambulatory Visit: Payer: Self-pay

## 2021-04-21 VITALS — BP 120/72 | Ht 65.0 in | Wt 211.6 lb

## 2021-04-21 DIAGNOSIS — O24112 Pre-existing diabetes mellitus, type 2, in pregnancy, second trimester: Secondary | ICD-10-CM | POA: Insufficient documentation

## 2021-04-21 NOTE — Progress Notes (Signed)
Patient's BG record indicates pre-meal (not fasting) BGs ranging 148-210. She denies any hypoglycemic episodes.  Patient's food recall indicates generally balanced meals, some meal likely low in protein. She is eating at regular intervals, but reports some times she relies on quick and readily available food options which result in higher carb/ refined carb intake.  She has eliminated consumption of sugar sweetened beverages, is better controlling portions of carb foods, and making healthier food choices most of the time. Provided basic balanced meal plan, and provided sample balanced menus. Advised having low BG treatment on hand if she begins insulin treatment. Patient is familiar with proper treatment for hypoglycemia. Instructed patient on food safety, including avoidance of Listeriosis, and limiting mercury from fish. Discussed importance of maintaining healthy lifestyle habits to improve/ maintain control of Type 2 DM as well as Gestational DM with any future pregnancies. Reviewed non-food factors that affect BG control, including sleep quantity and quality, physical activity, stress management.

## 2021-04-21 NOTE — Patient Instructions (Signed)
Try Kodiak pancake mix or frozen pancakes/ waffles, keep to 2 or less at a time, and use sugar free syrup (as long as there is no saccharin). Make sure to include a protein food with a small portion of oatmeal or cereal. If you eat cereal, make sure it is whole grain/ high fiber/ bran and low in sugar, ideally 10g or less per serving.  Good work to control carb portions, if still hungry when eating, it's ok to increase protein and low carb vegetables.

## 2021-05-06 ENCOUNTER — Ambulatory Visit: Payer: Medicaid Other | Attending: Certified Nurse Midwife

## 2021-05-06 ENCOUNTER — Ambulatory Visit: Payer: Medicaid Other

## 2021-05-06 DIAGNOSIS — O24319 Unspecified pre-existing diabetes mellitus in pregnancy, unspecified trimester: Secondary | ICD-10-CM | POA: Insufficient documentation

## 2021-05-06 DIAGNOSIS — O99210 Obesity complicating pregnancy, unspecified trimester: Secondary | ICD-10-CM | POA: Insufficient documentation

## 2021-06-11 ENCOUNTER — Ambulatory Visit: Payer: Medicaid Other

## 2021-06-17 ENCOUNTER — Ambulatory Visit: Payer: Medicaid Other

## 2021-06-24 ENCOUNTER — Other Ambulatory Visit: Payer: Self-pay | Admitting: *Deleted

## 2021-06-24 ENCOUNTER — Ambulatory Visit (HOSPITAL_BASED_OUTPATIENT_CLINIC_OR_DEPARTMENT_OTHER): Payer: Medicaid Other | Admitting: Obstetrics and Gynecology

## 2021-06-24 ENCOUNTER — Ambulatory Visit: Payer: Medicaid Other | Attending: Certified Nurse Midwife

## 2021-06-24 ENCOUNTER — Other Ambulatory Visit: Payer: Self-pay

## 2021-06-24 ENCOUNTER — Ambulatory Visit: Payer: Medicaid Other | Admitting: *Deleted

## 2021-06-24 ENCOUNTER — Encounter: Payer: Self-pay | Admitting: *Deleted

## 2021-06-24 VITALS — BP 112/77 | HR 95

## 2021-06-24 DIAGNOSIS — O24312 Unspecified pre-existing diabetes mellitus in pregnancy, second trimester: Secondary | ICD-10-CM | POA: Diagnosis not present

## 2021-06-24 DIAGNOSIS — Z3A27 27 weeks gestation of pregnancy: Secondary | ICD-10-CM | POA: Diagnosis present

## 2021-06-24 DIAGNOSIS — O24319 Unspecified pre-existing diabetes mellitus in pregnancy, unspecified trimester: Secondary | ICD-10-CM

## 2021-06-24 DIAGNOSIS — O24410 Gestational diabetes mellitus in pregnancy, diet controlled: Secondary | ICD-10-CM

## 2021-06-24 DIAGNOSIS — O99212 Obesity complicating pregnancy, second trimester: Secondary | ICD-10-CM

## 2021-06-24 DIAGNOSIS — O34219 Maternal care for unspecified type scar from previous cesarean delivery: Secondary | ICD-10-CM

## 2021-06-24 DIAGNOSIS — O09522 Supervision of elderly multigravida, second trimester: Secondary | ICD-10-CM

## 2021-06-24 DIAGNOSIS — O09212 Supervision of pregnancy with history of pre-term labor, second trimester: Secondary | ICD-10-CM

## 2021-06-24 DIAGNOSIS — E669 Obesity, unspecified: Secondary | ICD-10-CM

## 2021-06-24 NOTE — Progress Notes (Signed)
Maternal-Fetal Medicine  ? ?Name: Alexa Rosales ?DOB: 1986-04-11 ?MRN: [id number] ?Referring Provider: Lucrezia Europe, CNM ? ?[facility] the [facility].  I had the pleasure of seeing [name] today at the [facility]. She is G6 P5 at 27w 3d gestation and is here for fetal anatomy scan and consultation. Her problems include: ?-Pregestational diabetes. ?-Advanced maternal age. ?-Previous cesarean delivery. ? ?In November 2022, her hemoglobin A1c was increased at 7.9% that is consistent with type 2 diabetes (pregestational diabetes).  Patient reports she takes Lantus insulin 30 units at night and Humalog 18/18/18 units with meals.  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  From her blood glucose log (phone) I see some postprandial values in the 200s. ?She had numbness in her arms and was evaluated by neurologist last week.  Her symptoms were thought to be consistent with carpal tunnel syndrome and she was advised braces. ?She does not have nephropathy.  Patient has not had ophthalmology examination to rule out proliferative retinopathy. ?She does not have hypertension or thyroid disorder or any other chronic medical conditions. ? ?Past surgical history: Cesarean section, hernia surgery. ?Medications: Insulin, prenatal vitamins, low-dose aspirin.  Prilosec, Zyrtec, Flonase as needed.  Vitamin B12 injections. ?Allergies: Sulfa drugs (rashes). ?Social history: Denies tobacco or drug or alcohol use.  She is divorced and her partner is a father of her last 2 children and this pregnancy (baby). ?Family history: Mother has diabetes.  No history of venous thromboembolism in the family. ?Obstetric history ?-2004: Term vaginal delivery of a female infant weighing 7 pounds at birth.  Her pregnancy and delivery were uncomplicated. ?-2006: Term vaginal delivery of  female infant weighing 6 pounds and 6 ounces at birth. ?-2007: Term cesarean delivery (failure to progress in labor) of a female infant weighing 9 pounds at birth. ?-2014: VBAC of a female infant weighing 7 pounds. ?-2015: VBAC of a female infant weighing 7 pounds at birth ?GYN history: No recent history of abnormal Pap smears or cervical surgeries.  No history of breast disease. ?Prenatal: On cell-free fetal DNA screening, the risks of fetal aneuploidies are not increased. ? ?Ultrasound ?Fetal growth is appropriate for gestational age.  The estimated fetal weight is at the 18th percentile.  Femur length measurement is at the 6th percentile.  Amniotic fluid is normal and good fetal activity seen.  Minimal pericardial effusion is seen (physiological).  I cannot rule out the possibility of a persistent right umbilical vein (in addition to the left umbilical vein) bypassing the liver and draining into the inferior vena cava.  Cardiac anatomy views are limited. ?Rest of the fetal anatomy appears normal.  Placenta is fundal and there is no evidence of previa or placenta accreta spectrum. ? ?Pregestational diabetes ?-Diagnosis is consistent with pregestational diabetes based on increased hemoglobin A1c level. ?-I discussed the importance of regular blood glucose monitoring to prevent adverse fetal or neonatal outcomes.  I explained the normal glucose parameters and encouraged the patient to check her fasting and 2-hour postprandial levels. ?-Complications include fetal macrosomia, shoulder dystocia or neurological injuries, stillbirth, and neonatal complications including respiratory distress syndrome and hypoglycemia. ?-Gestational hypertension/preeclampsia is more common in pregnancies complicated by diabetes. ?-Discussed ultrasound protocol of weekly antenatal testing from [redacted] weeks gestation till delivery. ?-If diabetes is well controlled, she can be delivered at [redacted] weeks gestation.Marland Kitchen  If diabetes not well controlled, early  term delivery (37 or 38 weeks) may be appropriate. ?-Discussed the importance of control of diabetes to prevent long-term complications. ?-I have recommended eye exam to rule out proliferative retinopathy. ? ?Persistent right umbilical vein ?-The diagnosis is not certain and needs confirmation by fetal echocardiography by pediatric cardiologist.  I discussed the finding of persistent right umbilical vein.  In the absence of other associated anomalies, we expect good pregnancy outcomes. ? ?Advanced maternal age ?I discussed the significance of cell free fetal DNA screening that has a greater detection rate for fetal Down syndrome.  It does not, however, rule out all chromosomal anomalies. ? ?Recommendations ?-An appointment was made for her to return in 4 weeks for fetal growth assessment and BPP. ?-Weekly BPP from her next visit till delivery. ?-We have requested an appointment for fetal echocardiography ([facility]). ?-Recommend ophthalmology examination to rule out proliferative retinopathy. ?-Patient to discuss with the provider (or endocrinologist) to discuss blood glucose values and insulin adjustments. ?-Patient had nutritional counseling and I recommended that she revisit her diabetic educator. ? ? ?Thank you for consultation.  If you have any questions or concerns, please contact me the [facility].  Consultation including face-to-face (more than 50%) counseling 45 minutes. ? ?

## 2021-07-28 ENCOUNTER — Encounter: Payer: Self-pay | Admitting: *Deleted

## 2021-07-28 ENCOUNTER — Ambulatory Visit: Payer: Medicaid Other | Attending: Obstetrics and Gynecology

## 2021-07-28 ENCOUNTER — Ambulatory Visit: Payer: Medicaid Other | Admitting: *Deleted

## 2021-07-28 VITALS — BP 133/79 | HR 89

## 2021-07-28 DIAGNOSIS — O24319 Unspecified pre-existing diabetes mellitus in pregnancy, unspecified trimester: Secondary | ICD-10-CM | POA: Diagnosis present

## 2021-07-28 DIAGNOSIS — Z3A32 32 weeks gestation of pregnancy: Secondary | ICD-10-CM

## 2021-07-28 DIAGNOSIS — E669 Obesity, unspecified: Secondary | ICD-10-CM

## 2021-07-28 DIAGNOSIS — O99213 Obesity complicating pregnancy, third trimester: Secondary | ICD-10-CM

## 2021-07-28 DIAGNOSIS — O09523 Supervision of elderly multigravida, third trimester: Secondary | ICD-10-CM | POA: Diagnosis not present

## 2021-07-28 DIAGNOSIS — O24113 Pre-existing diabetes mellitus, type 2, in pregnancy, third trimester: Secondary | ICD-10-CM | POA: Diagnosis not present

## 2021-07-28 DIAGNOSIS — O09213 Supervision of pregnancy with history of pre-term labor, third trimester: Secondary | ICD-10-CM

## 2021-07-28 DIAGNOSIS — O34219 Maternal care for unspecified type scar from previous cesarean delivery: Secondary | ICD-10-CM

## 2021-08-01 ENCOUNTER — Other Ambulatory Visit: Payer: Self-pay

## 2021-08-01 ENCOUNTER — Encounter: Payer: Self-pay | Admitting: Emergency Medicine

## 2021-08-01 ENCOUNTER — Ambulatory Visit
Admission: EM | Admit: 2021-08-01 | Discharge: 2021-08-01 | Disposition: A | Discharge status: Home / Self Care | Payer: Medicaid Other | Attending: Physician Assistant | Admitting: Physician Assistant

## 2021-08-01 DIAGNOSIS — Z3A38 38 weeks gestation of pregnancy: Secondary | ICD-10-CM

## 2021-08-01 DIAGNOSIS — J309 Allergic rhinitis, unspecified: Secondary | ICD-10-CM

## 2021-08-01 DIAGNOSIS — O26893 Other specified pregnancy related conditions, third trimester: Secondary | ICD-10-CM | POA: Insufficient documentation

## 2021-08-01 DIAGNOSIS — O99513 Diseases of the respiratory system complicating pregnancy, third trimester: Secondary | ICD-10-CM | POA: Diagnosis not present

## 2021-08-01 DIAGNOSIS — R0981 Nasal congestion: Secondary | ICD-10-CM

## 2021-08-01 DIAGNOSIS — J3489 Other specified disorders of nose and nasal sinuses: Secondary | ICD-10-CM | POA: Diagnosis not present

## 2021-08-01 DIAGNOSIS — R051 Acute cough: Secondary | ICD-10-CM | POA: Diagnosis not present

## 2021-08-01 DIAGNOSIS — Z20822 Contact with and (suspected) exposure to covid-19: Secondary | ICD-10-CM | POA: Insufficient documentation

## 2021-08-01 LAB — RESP PANEL BY RT-PCR (FLU A&B, COVID) ARPGX2
Influenza A by PCR: NEGATIVE
Influenza B by PCR: NEGATIVE
SARS Coronavirus 2 by RT PCR: NEGATIVE

## 2021-08-01 MED ORDER — IPRATROPIUM BROMIDE 0.06 % NA SOLN
2.0000 | Freq: Four times a day (QID) | NASAL | 0 refills | Status: DC
Start: 2021-08-01 — End: 2022-03-04

## 2021-08-01 NOTE — ED Triage Notes (Signed)
Pt c/o nasal congestion, cough. Started about 3 days ago.denies fever. Pt is [redacted] weeks pregnant.  ?

## 2021-08-01 NOTE — ED Provider Notes (Signed)
?[facility] ? ? ? ?CSN: [id number] ?Arrival date & time: 08/01/21  0806 ? ? ?  ? ?History   ?Chief Complaint ?Chief Complaint  ?Patient presents with  ? Cough  ? Nasal Congestion  ? ? ?HPI ?Alexa Rosales is a 36 y.o. female who is [redacted] weeks pregnant.  Patient presents today for approximately 3-day history of nasal congestion with little drainage to that is yellow, cough and postnasal drainage.  Reports fatigue but nothing necessarily more than she was already experiencing with her pregnancy.  She denies any associated fevers or chills.  No breathing difficulty.  Patient says her gums hurt.  She is worried about a sinus infection.  States she had a sinus infection about 2 months ago and was placed on antibiotics.  Reports a history of cysts in her nose which makes her prone to sinus infections.  Patient has been using Flonase and taking over-the-counter Sudafed for symptoms.  She says she recently ran out of her antihistamines so has not been on them.  She does have a history of allergies.  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  She would like to be tested.  She has no other concerns. ? ?HPI ? ?Past Medical History:  ?Diagnosis Date  ? Diabetes mellitus without complication ([place])   ? History of anemia   ? ? ?Patient Active Problem List  ? Diagnosis Date Noted  ? Missed period 11/03/2020  ? Vaginal discharge 11/03/2020  ? Chronic rhinitis 07/23/2018  ? Abnormal uterine bleeding (AUB) 12/29/2015  ? Family history of breast cancer in female 11/15/2011  ? Allergic rhinitis 08/25/2011  ? Genital herpes 08/25/2011  ? ? ?Past Surgical History:  ?Procedure Laterality Date  ? CESAREAN SECTION    ? HERNIA REPAIR    ? WISDOM TOOTH EXTRACTION    ? ? ?OB History   ? ? Gravida  ?6  ? Para  ?5  ? Term  ?4  ? Preterm  ?1  ? AB  ?   ? Living  ?5  ?  ? ? SAB  ?   ? IAB  ?   ? Ectopic  ?   ? Multiple  ?   ? Live Births  ?5  ?   ?  ? Obstetric Comments  ?Due Date September 20, 2021  ?  ? ?  ? ? ? ?Home Medications    ? ?Prior to Admission medications   ?Medication Sig Start Date End Date Taking? Authorizing Provider  ?aspirin 81 MG EC tablet Take 81 mg by mouth daily.   Yes [provider]  ?cetirizine (ZYRTEC) 5 MG tablet Take 5 mg by mouth daily.   Yes [provider]  ?Cyanocobalamin (VITAMIN B 12 PO) Take 1,000 mcg by mouth daily at 6 (six) AM.   Yes [provider]  ?fluticasone (FLONASE) 50 MCG/ACT nasal spray Place 1-2 sprays into both nostrils daily as needed for allergies or rhinitis.   Yes [provider]  ?insulin glargine (LANTUS SOLOSTAR) 100 UNIT/ML Solostar Pen Inject into the skin. 06/14/21 09/12/21 Yes [provider]  ?ipratropium (ATROVENT) 0.06 % nasal spray Place 2 sprays into both nostrils 4 (four) times daily. 08/01/21  Yes Laurene Footman B, PA-C  ?omeprazole (PRILOSEC) 20 MG capsule Take 20 mg by mouth as needed.   Yes [provider]  ?Prenatal 28-0.8 MG TABS Take 1 tablet by mouth daily.   Yes [provider]  ?ondansetron (ZOFRAN) 4 MG tablet  Take 4 mg by mouth every 6 (six) hours. ?Patient not taking: Reported on 04/15/2021 02/18/21   [provider]  ?ferrous sulfate 325 (65 FE) MG tablet Take 325 mg by mouth daily with breakfast.  02/15/20  [provider]  ? ? ?Family History ?Family History  ?Problem Relation Age of Onset  ? Diabetes Mother   ? Hypertension Mother   ? Sarcoidosis Father   ? Asthma Sister   ? Breast cancer Maternal Aunt   ?     great MAT  ? Diabetes Maternal Grandmother   ? Colon cancer Maternal Grandmother   ? Breast cancer Paternal Grandmother   ? ? ?Social History ?Social History  ? ?Tobacco Use  ? Smoking status: Never  ? Smokeless tobacco: Never  ?Vaping Use  ? Vaping Use: Never used  ?Substance Use Topics  ? Alcohol use: Not Currently  ?  Comment: occassional  ? Drug use: No  ? ? ? ?Allergies   ?Sulfa antibiotics ? ? ?Review of Systems ?Review of Systems  ?Constitutional:  Positive for fatigue.  Negative for chills, diaphoresis and fever.  ?HENT:  Positive for congestion, rhinorrhea and sinus pressure. Negative for ear pain, sinus pain and sore throat.   ?Respiratory:  Positive for cough. Negative for shortness of breath.   ?Cardiovascular:  Negative for chest pain.  ?Gastrointestinal:  Negative for abdominal pain, nausea and vomiting.  ?Musculoskeletal:  Negative for arthralgias and myalgias.  ?Skin:  Negative for rash.  ?Neurological:  Negative for weakness and headaches.  ?Hematological:  Negative for adenopathy.  ? ? ?Physical Exam ?Triage Vital Signs ?ED Triage Vitals [08/01/21 0812]  ?Enc Vitals Group  ?   BP   ?   Pulse   ?   Resp   ?   Temp   ?   Temp src   ?   SpO2   ?   Weight 211 lb 10.3 oz (96 kg)  ?   Height '5\' 5"'$  (1.651 m)  ?   Head Circumference   ?   Peak Flow   ?   Pain Score 3  ?   Pain Loc   ?   Pain Edu?   ?   Excl. in [place]?   ? ?No data found. ? ?Updated Vital Signs ?BP 136/84 (BP Location: Right Arm)   Pulse 96   Temp 98.3 ?F (36.8 ?C) (Oral)   Resp 18   Ht '5\' 5"'$  (1.651 m)   Wt 211 lb 10.3 oz (96 kg)   LMP 12/07/2020   SpO2 100%   BMI 35.22 kg/m?  ?   ? ?Physical Exam ?Vitals and nursing note reviewed.  ?Constitutional:   ?   General: She is not in acute distress. ?   Appearance: Normal appearance. She is not ill-appearing or toxic-appearing.  ?HENT:  ?   Head: Normocephalic and atraumatic.  ?   Right Ear: A middle ear effusion is present. Impacted cerumen: mild, clear.  ?   Left Ear: A middle ear effusion (mild, clear) is present.  ?   Nose: Congestion and rhinorrhea present.  ?   Mouth/Throat:  ?   Mouth: Mucous membranes are moist.  ?   Pharynx: Oropharynx is clear.  ?Eyes:  ?   General: No scleral icterus.    ?   Right eye: No discharge.     ?   Left eye: No discharge.  ?   Conjunctiva/sclera: Conjunctivae normal.  ?Cardiovascular:  ?   Rate and  Rhythm: Normal rate and regular rhythm.  ?   Heart sounds: Normal heart sounds.  ?Pulmonary:  ?   Effort: Pulmonary effort is  normal. No respiratory distress.  ?   Breath sounds: Normal breath sounds.  ?Musculoskeletal:  ?   Cervical back: Neck supple.  ?Skin: ?   General: Skin is dry.  ?Neurological:  ?   General: No focal deficit present.  ?   Mental Status: She is alert. Mental status is at baseline.  ?   Motor: No weakness.  ?   Gait: Gait normal.  ?Psychiatric:     ?   Mood and Affect: Mood normal.     ?   Behavior: Behavior normal.     ?   Thought Content: Thought content normal.  ? ? ? ?UC Treatments / Results  ?Labs ?(all labs ordered are listed, but only abnormal results are displayed) ?Labs Reviewed  ?RESP PANEL BY RT-PCR (FLU A&B, COVID) ARPGX2  ? ? ?EKG ? ? ?Radiology ?No results found. ? ?Procedures ?Procedures (including critical care time) ? ?Medications Ordered in UC ?Medications - No data to display ? ?Initial Impression / Assessment and Plan / UC Course  ?I have reviewed the triage vital signs and the nursing notes. ? ?Pertinent labs & imaging results that were available during my care of the patient were reviewed by me and considered in my medical decision making (see chart for details). ? ?36 year old female who is [redacted] weeks pregnant is presenting for 3-day history of nasal congestion, sinus pressure and cough.  Patient has history of allergies and has not been taking antihistamines.  Has been taking over-the-counter Sudafed and Flonase without improvement in symptoms.  No known COVID exposure but would like to be tested. ? ?Vitals normal and stable.  She is afebrile.  She is overall well-appearing.  On exam she does have nasal congestion with small amount of clear drainage, mild effusion of bilateral TMs with clear fluid and postnasal drainage.  Chest clear to auscultation and heart regular rate rhythm. ? ?Respiratory panel obtained.  Advised patient we will contact her with any positive results.  Reviewed current CDC guidelines, isolation protocol and ED precautions.  If she is positive she does not want antiviral  medicine. ? ?Advised patient symptoms most consistent with her allergies and she would benefit from getting back on antihistamines.  Also I have sent Atrovent nasal spray and encouraged her to increase rest and fluids.  Fol

## 2021-08-01 NOTE — Discharge Instructions (Addendum)
-  We will call with results of your COVID and flu test if your test is positive.  We will call if it is negative.  Results via MyChart and note if needed.  If you do have COVID-19 you will need to isolate 5 days wear mask for 5 days and inform your OB/GYN.  For any severe symptoms such as weakness, uncontrollable fever or breathing difficulty, go to ER. ?- Symptoms likely related to allergies versus a virus.  No significant concern for sinus infection at this time but if you are not starting to improve over the next couple days especially after adding the different nasal spray and starting back on the antihistamines, please follow-up with PCP for reevaluation. ?- Increase rest and fluids. ?

## 2021-08-02 ENCOUNTER — Observation Stay
Admission: EM | Admit: 2021-08-02 | Discharge: 2021-08-02 | Disposition: A | Discharge status: Home / Self Care | Payer: Medicaid Other | Attending: Obstetrics and Gynecology | Admitting: Obstetrics and Gynecology

## 2021-08-02 ENCOUNTER — Observation Stay: Payer: Medicaid Other

## 2021-08-02 ENCOUNTER — Encounter: Payer: Self-pay | Admitting: Obstetrics and Gynecology

## 2021-08-02 DIAGNOSIS — Z79899 Other long term (current) drug therapy: Secondary | ICD-10-CM | POA: Insufficient documentation

## 2021-08-02 DIAGNOSIS — Z794 Long term (current) use of insulin: Secondary | ICD-10-CM | POA: Diagnosis not present

## 2021-08-02 DIAGNOSIS — Z3A33 33 weeks gestation of pregnancy: Secondary | ICD-10-CM | POA: Diagnosis not present

## 2021-08-02 DIAGNOSIS — Z6835 Body mass index (BMI) 35.0-35.9, adult: Secondary | ICD-10-CM | POA: Insufficient documentation

## 2021-08-02 DIAGNOSIS — O368130 Decreased fetal movements, third trimester, not applicable or unspecified: Principal | ICD-10-CM | POA: Diagnosis present

## 2021-08-02 DIAGNOSIS — Z98891 History of uterine scar from previous surgery: Secondary | ICD-10-CM | POA: Diagnosis not present

## 2021-08-02 DIAGNOSIS — O09523 Supervision of elderly multigravida, third trimester: Secondary | ICD-10-CM | POA: Diagnosis not present

## 2021-08-02 DIAGNOSIS — O24313 Unspecified pre-existing diabetes mellitus in pregnancy, third trimester: Secondary | ICD-10-CM | POA: Insufficient documentation

## 2021-08-02 DIAGNOSIS — O99213 Obesity complicating pregnancy, third trimester: Secondary | ICD-10-CM | POA: Insufficient documentation

## 2021-08-02 DIAGNOSIS — Z7982 Long term (current) use of aspirin: Secondary | ICD-10-CM | POA: Diagnosis not present

## 2021-08-02 DIAGNOSIS — E669 Obesity, unspecified: Secondary | ICD-10-CM | POA: Diagnosis not present

## 2021-08-02 NOTE — Discharge Summary (Signed)
Alexa Rosales is a 36 y.o. female. She is at 1w0dgestation. Patient's last menstrual period was 12/07/2020. ?Estimated Date of Delivery: 09/20/21 ? ?[facility]  ? ?Current pregnancy complicated by:  ?Maternal Age >>65yo ?? Age at delivery: 89 ?? Genetic Screening: Negative ?? ASA at 12 weeks-patient is aware ?2. Previous C/S x1 ?? C/s done for: FTP, macrosomia  ?? '[]'$  Request for records sent:  ?? Delivery preference:[facility] 3rd VBAC ?3. Obesity BMI: 35.35 ?? Baseline labs: 03/05/21 ?? Early 1 hour GTT: (not done random glucose 214) ?? P/C ratio: 79  ?? CMP: within normal limits  ?? A1c: 7.9 ?4.H/o mental health diagnoses ?? Dx: Anxiety-per patient, no formal DX ?5. Herpes ?? Prophylaxis at 36 weeks ?6. Preexisting DM - dx at NOB ?? Labs: ?? 03/05/2021 A1C 7.9 ?? 04/29/21 A1C 7.3 ?? Referrals: ?? Lifestyles referral: completed  ?? [facility] - managing insulin  ?? Fetal Echo - referral placed by MFM  ?? No further prenatal cardiac follow-up is required unless any new cardiac concerns are noted.  ?? No fetal cardiac indication to alter newborn care. ?? No postnatal cardiology consultation or echocardiogram is indicated at this time. ?? Medications: ?? 06/24/2021 - Lantus 40 units, Lispro 28 units with meals per OB Endocrinology  ? ? ?Chief complaint: decreased fetal movement for about 2d, worked this weekend and has noticed minimal movement until she arrived to triage this am. Also noted some spotting after IC Friday eve but this has resolved.  ? ?S: Resting comfortably. no CTX, no VB.no LOF,  Active fetal movement. Denies: HA, visual changes, SOB, or RUQ/epigastric pain ? ?Maternal Medical History:  ? ?Past Medical History:  ?Diagnosis Date  ? [place] mellitus without complication ([place]   ? History of anemia   ? ? ?Past Surgical History:  ?Procedure Laterality Date  ? CESAREAN SECTION    ? HERNIA REPAIR    ? WISDOM TOOTH EXTRACTION    ? ? ?Allergies  ?Allergen Reactions  ?  Sulfa Antibiotics   ? ? ?Prior to Admission medications   ?Medication Sig Start Date End Date Taking? Authorizing Provider  ?aspirin 81 MG EC tablet Take 81 mg by mouth daily.   Yes [provider]  ?Cyanocobalamin (VITAMIN B 12 PO) Take 1,000 mcg by mouth daily at 6 (six) AM.   Yes [provider]  ?fluticasone (FLONASE) 50 MCG/ACT nasal spray Place 1-2 sprays into both nostrils daily as needed for allergies or rhinitis.   Yes [provider]  ?insulin glargine (LANTUS SOLOSTAR) 100 UNIT/ML Solostar Pen Inject 30 Units into the skin at bedtime. 06/14/21 09/12/21 Yes [provider]  ?insulin lispro (HUMALOG) 100 UNIT/ML injection Inject 28 Units into the skin 3 (three) times daily before meals.   Yes [provider]  ?Prenatal 28-0.8 MG TABS Take 1 tablet by mouth daily.   Yes [provider]  ?cetirizine (ZYRTEC) 5 MG tablet Take 5 mg by mouth daily.    [provider]  ?ipratropium (ATROVENT) 0.06 % nasal spray Place 2 sprays into both nostrils 4 (four) times daily. 08/01/21   EDanton Clap PA-C  ?omeprazole (PRILOSEC) 20 MG capsule Take 20 mg by mouth as needed.    [provider]  ?ondansetron (ZOFRAN) 4 MG tablet Take 4 mg by mouth every 6 (six) hours. ?Patient not taking: Reported on 04/15/2021 02/18/21   [provider]  ?ferrous sulfate 325 (65 FE) MG tablet Take 325  mg by mouth daily with breakfast.  02/15/20  [provider]  ? ? ? ? ?Social History: She  reports that she has never smoked. She has never used smokeless tobacco. She reports that she does not currently use alcohol. She reports that she does not use drugs. ? ?Family History: family history includes Asthma in her sister; Breast cancer in her maternal aunt and paternal grandmother; Colon cancer in her maternal grandmother; Diabetes in her maternal grandmother and mother; Hypertension in her mother; Sarcoidosis in her father.  ? ?Review of Systems: A full  review of systems was performed and negative except as noted in the HPI.   ? ? ?O: ? BP 133/75 (BP Location: Right Arm)   Pulse 99   Temp 98.3 ?F (36.8 ?C) (Oral)   Resp 17   Ht '5\' 6"'$  (1.676 m)   Wt 98.9 kg   LMP 12/07/2020   BMI 35.19 kg/m?  ?Results for orders placed or performed during the hospital encounter of 08/01/21 (from the past 48 hour(s))  ?Resp Panel by RT-PCR (Flu A&B, Covid) Nasopharyngeal Swab  ? Collection Time: 08/01/21  8:19 AM  ? Specimen: Nasopharyngeal Swab; Nasopharyngeal(NP) swabs in vial transport medium  ?Result Value Ref Range  ? SARS Coronavirus 2 by RT PCR NEGATIVE NEGATIVE  ? Influenza A by PCR NEGATIVE NEGATIVE  ? Influenza B by PCR NEGATIVE NEGATIVE  ?  ? ?Constitutional: NAD, AAOx3  ?HE/ENT: extraocular movements grossly intact, moist mucous membranes ?CV: RRR ?PULM: nl respiratory effort, CTABL     ?Abd: gravid, non-tender, non-distended, soft      ?Ext: Non-tender, Nonedematous   ?Psych: mood appropriate, speech normal ?Pelvic: deferred ? ?Fetal  monitoring: Cat I Appropriate for GA ?Baseline: 150 ?Variability: moderate ?Accelerations:  present x >2 ?Decelerations absent ?Time 50mns ? ?TOCO: no UCs noted.  ? ? ?OB Limited [place] ?IMPRESSION: ?1. Single live intrauterine pregnancy with estimated gestational age ?of 320 weeksand 0 days by femur length measurement. ?2. Fetal heart rate measured at 160 beats per minute. ?3. AFI within normal limits at 13.2 cm. ?  ? ? ?A/P: 36y.o. 352w0dere for antenatal surveillance for decreased fetal movement.  ? ?Principle Diagnosis:  decreased fetal movement ? ?Labor: not present.  ?Fetal Wellbeing: Reassuring Cat 1 tracing with Reactive NST  ?Reassuring [place] normal AFI.  ?D/c home stable, precautions reviewed, follow-up as scheduled.  ? ? ?ReFrancetta FoundCNM ?08/02/2021  ?12:34 PM ? ?

## 2021-08-02 NOTE — OB Triage Note (Signed)
Patient is a [id number] at 80w0dwho presents to unit c/o decreased fetal movement for the last 2 days and vaginal bleeding on Saturday after intercourse. Bleeding has since resolved but has only felt baby kick a couple times. External monitors applied and assessing. Initial FHT 150. Patient feeling baby move since monitors applied. Vital signs WDL.  ?

## 2021-08-02 NOTE — OB Triage Note (Signed)
Discharge instructions, labor precautions, and follow-up care reviewed with patient. All questions answered. Patient verbalized understanding. Discharged ambulatory off unit.   

## 2021-08-16 ENCOUNTER — Other Ambulatory Visit: Payer: Self-pay | Admitting: Obstetrics and Gynecology

## 2021-08-16 NOTE — Progress Notes (Signed)
Alexa Rosales is a 36 y.o. [phone] female at 73w0ddated by 7 week u/s.  She presents to L&D for IOL for insulin dependent DM. ? ?Pregnancy Issues: ?1. Maternal Age >>51yo ?2. Previous C/S x1 ?3. Obesity BMI: 35.35 ?4.H/o mental health diagnoses ?5. Herpes ?6. Preexisting DM - dx at NOB ? ? ?[facility]  ? ?EFW: 08/16/21  5lb13oz [phone] = 66% ? ? ?Pertinent Results:  ?Prenatal Labs: ?Blood type/Rh O pos  ?Antibody screen neg  ?Rubella Immune  ?Varicella Immune  ?RPR NR  ?HBsAg Neg  ?HIV NR  ?GC neg  ?Chlamydia neg  ?Genetic screening negative  ?1 hour GTT Pre-existing DM  ?3 hour GTT   ?GBS pending  ? ? ?5. Post Partum Planning: ?- Infant feeding: Breastfeeding ?- Contraception: Desires BTL - consents signed 07/02/2021 ?- Tdap Not given ?- Flu Given at work ? ?JLinda Hedges CNM ?08/16/2021 8:04 PM  ?

## 2021-08-22 ENCOUNTER — Ambulatory Visit
Admission: EM | Admit: 2021-08-22 | Discharge: 2021-08-22 | Disposition: A | Discharge status: Home / Self Care | Payer: Medicaid Other | Attending: Emergency Medicine | Admitting: Emergency Medicine

## 2021-08-22 DIAGNOSIS — J40 Bronchitis, not specified as acute or chronic: Secondary | ICD-10-CM | POA: Diagnosis not present

## 2021-08-22 DIAGNOSIS — O09523 Supervision of elderly multigravida, third trimester: Secondary | ICD-10-CM | POA: Insufficient documentation

## 2021-08-22 DIAGNOSIS — O0993 Supervision of high risk pregnancy, unspecified, third trimester: Secondary | ICD-10-CM | POA: Insufficient documentation

## 2021-08-22 MED ORDER — CEFDINIR 300 MG PO CAPS: 300.0000 mg | ORAL_CAPSULE | Freq: Two times a day (BID) | ORAL | 0 refills | Status: AC

## 2021-08-22 MED ORDER — ALBUTEROL SULFATE HFA 108 (90 BASE) MCG/ACT IN AERS: 2.0000 | INHALATION_SPRAY | RESPIRATORY_TRACT | 0 refills | Status: DC | PRN

## 2021-08-22 MED ORDER — AEROCHAMBER MV MISC
2 refills | Status: DC
Start: 2021-08-22 — End: 2022-07-06

## 2021-08-22 NOTE — ED Provider Notes (Signed)
?[facility] ? ? ? ?CSN: [id number] ?Arrival date & time: 08/22/21  0803 ? ? ?  ? ?History   ?Chief Complaint ?Chief Complaint  ?Patient presents with  ? Cough  ? ? ?HPI ?Alexa Rosales is a 36 y.o. female.  ? ?HPI ? ?36 year old female here for evaluation of respiratory complaints. ? ?The patient is currently 35 weeks 6 days gestation with a complaint of productive cough x1 month.  She was seen initially at urgent care and diagnosed with allergic rhinitis with postnasal drip and prescribed Zyrtec, Flonase, and Atrovent.  She reports that she has been taking all of these medications without any improvement of her symptoms and in the last 3 days she developed wheezing and shortness of breath the last 2 days.  Her cough has had increased production of yellow better sputum over the past week.  Patient has not mounted a fever.  She states that she talked with her OB when she was initially diagnosed but has not spoken to her OB since then about her ongoing symptoms.  She is followed by [facility] and had her last appointment for routine prenatal physical on 08/16/2021.  She is currently being followed for advanced maternal age, supervision of high risk pregnancy secondary to pre-existing diabetes, antepartum obesity, and multigravida with previous cesarean section affecting pregnancy. ? ?Past Medical History:  ?Diagnosis Date  ? Diabetes mellitus without complication ([place])   ? History of anemia   ? ? ?Patient Active Problem List  ? Diagnosis Date Noted  ? Supervision of high risk pregnancy in third trimester 08/22/2021  ? AMA (advanced maternal age) multigravida 71+, third trimester 08/22/2021  ? Decreased fetal movement affecting management of pregnancy in third trimester 08/02/2021  ? Preexisting diabetes complicating pregnancy, antepartum 05/06/2021  ? Obesity in pregnancy, antepartum 05/06/2021  ? Missed period 11/03/2020  ? Vaginal discharge 11/03/2020  ? Chronic rhinitis 07/23/2018  ? Abnormal  uterine bleeding (AUB) 12/29/2015  ? Menorrhagia 11/13/2012  ? Family history of breast cancer in female 11/15/2011  ? Previous cesarean delivery affecting pregnancy, antepartum 11/15/2011  ? Allergic rhinitis 08/25/2011  ? Genital herpes 08/25/2011  ? ? ?Past Surgical History:  ?Procedure Laterality Date  ? CESAREAN SECTION    ? HERNIA REPAIR    ? WISDOM TOOTH EXTRACTION    ? ? ?OB History   ? ? Gravida  ?6  ? Para  ?5  ? Term  ?4  ? Preterm  ?1  ? AB  ?   ? Living  ?5  ?  ? ? SAB  ?   ? IAB  ?   ? Ectopic  ?   ? Multiple  ?   ? Live Births  ?5  ?   ?  ? Obstetric Comments  ?Due Date September 20, 2021  ?  ? ?  ? ? ? ?Home Medications   ? ?Prior to Admission medications   ?Medication Sig Start Date End Date Taking? Authorizing Provider  ?albuterol (VENTOLIN HFA) 108 (90 Base) MCG/ACT inhaler Inhale 2 puffs into the lungs every 4 (four) hours as needed. 08/22/21  Yes Margarette Canada, NP  ?aspirin 81 MG EC tablet Take 81 mg by mouth daily.   Yes [provider]  ?cefdinir (OMNICEF) 300 MG capsule Take 1 capsule (300 mg total) by mouth 2 (two) times daily for 7 days. 08/22/21 08/29/21 Yes Margarette Canada, NP  ?cetirizine (ZYRTEC) 5 MG tablet Take 5 mg by mouth daily.   Yes [provider]  ?Cyanocobalamin (VITAMIN B 12 PO) Take 1,000 mcg by mouth daily at 6 (six) AM.   Yes [provider]  ?fluticasone (FLONASE) 50 MCG/ACT nasal spray Place 1-2 sprays into both nostrils daily as needed for allergies or rhinitis.   Yes [provider]  ?insulin glargine (LANTUS SOLOSTAR) 100 UNIT/ML Solostar Pen Inject 30 Units into the skin at bedtime. 06/14/21 09/12/21 Yes [provider]  ?insulin lispro (HUMALOG) 100 UNIT/ML injection Inject 28 Units into the skin 3 (three) times daily before meals.   Yes [provider]  ?ipratropium (ATROVENT) 0.06 % nasal spray Place 2 sprays into both nostrils 4 (four) times daily. 08/01/21  Yes Laurene Footman B, PA-C  ?omeprazole (PRILOSEC) 20 MG capsule Take  20 mg by mouth as needed.   Yes [provider]  ?Prenatal 28-0.8 MG TABS Take 1 tablet by mouth daily.   Yes [provider]  ?Spacer/Aero-Holding Chambers (AEROCHAMBER MV) inhaler Use as instructed 08/22/21  Yes Margarette Canada, NP  ?ferrous sulfate 325 (65 FE) MG tablet Take 325 mg by mouth daily with breakfast.  02/15/20  [provider]  ? ? ?Family History ?Family History  ?Problem Relation Age of Onset  ? Diabetes Mother   ? Hypertension Mother   ? Sarcoidosis Father   ? Asthma Sister   ? Breast cancer Maternal Aunt   ?     great MAT  ? Diabetes Maternal Grandmother   ? Colon cancer Maternal Grandmother   ? Breast cancer Paternal Grandmother   ? ? ?Social History ?Social History  ? ?Tobacco Use  ? Smoking status: Never  ? Smokeless tobacco: Never  ?Vaping Use  ? Vaping Use: Never used  ?Substance Use Topics  ? Alcohol use: Not Currently  ?  Comment: occassional  ? Drug use: No  ? ? ? ?Allergies   ?Sulfa antibiotics ? ? ?Review of Systems ?Review of Systems  ?Constitutional:  Negative for activity change, appetite change and fever.  ?HENT:  Positive for congestion.   ?Respiratory:  Positive for cough, shortness of breath and wheezing.   ? ? ?Physical Exam ?Triage Vital Signs ?ED Triage Vitals  ?Enc Vitals Group  ?   BP 08/22/21 0809 133/87  ?   Pulse Rate 08/22/21 0809 (!) 102  ?   Resp --   ?   Temp 08/22/21 0809 98 ?F (36.7 ?C)  ?   Temp Source 08/22/21 0809 Oral  ?   SpO2 08/22/21 0809 98 %  ?   Weight 08/22/21 0807 220 lb (99.8 kg)  ?   Height 08/22/21 0807 '5\' 5"'$  (1.651 m)  ?   Head Circumference --   ?   Peak Flow --   ?   Pain Score 08/22/21 0806 4  ?   Pain Loc --   ?   Pain Edu? --   ?   Excl. in [place]? --   ? ?No data found. ? ?Updated Vital Signs ?BP 133/87 (BP Location: Left Arm)   Pulse (!) 102   Temp 98 ?F (36.7 ?C) (Oral)   Ht '5\' 5"'$  (1.651 m)   Wt 220 lb (99.8 kg)   LMP 12/07/2020   SpO2 98%   BMI 36.61 kg/m?  ? ?Visual Acuity ?Right Eye Distance:   ?Left Eye Distance:    ?Bilateral Distance:   ? ?Right Eye Near:   ?Left Eye Near:    ?Bilateral Near:    ? ?Physical Exam ?Vitals and nursing note  reviewed.  ?Constitutional:   ?   Appearance: Normal appearance. She is obese. She is not ill-appearing.  ?HENT:  ?   Head: Normocephalic and atraumatic.  ?   Right Ear: Tympanic membrane, ear canal and external ear normal. There is no impacted cerumen.  ?   Left Ear: Tympanic membrane, ear canal and external ear normal. There is no impacted cerumen.  ?   Nose: Congestion and rhinorrhea present.  ?   Mouth/Throat:  ?   Mouth: Mucous membranes are moist.  ?   Pharynx: Oropharynx is clear. No posterior oropharyngeal erythema.  ?Cardiovascular:  ?   Rate and Rhythm: Normal rate and regular rhythm.  ?   Pulses: Normal pulses.  ?   Heart sounds: Normal heart sounds. No murmur heard. ?  No friction rub. No gallop.  ?Pulmonary:  ?   Effort: Pulmonary effort is normal.  ?   Breath sounds: Wheezing and rhonchi present. No rales.  ?Musculoskeletal:  ?   Cervical back: Normal range of motion and neck supple.  ?Lymphadenopathy:  ?   Cervical: No cervical adenopathy.  ?Skin: ?   General: Skin is warm and dry.  ?   Capillary Refill: Capillary refill takes less than 2 seconds.  ?   Findings: No erythema or rash.  ?Neurological:  ?   General: No focal deficit present.  ?   Mental Status: She is alert and oriented to person, place, and time.  ?Psychiatric:     ?   Mood and Affect: Mood normal.     ?   Behavior: Behavior normal.     ?   Thought Content: Thought content normal.     ?   Judgment: Judgment normal.  ? ? ? ?UC Treatments / Results  ?Labs ?(all labs ordered are listed, but only abnormal results are displayed) ?Labs Reviewed - No data to display ? ?EKG ? ? ?Radiology ?No results found. ? ?Procedures ?Procedures (including critical care time) ? ?Medications Ordered in UC ?Medications - No data to display ? ?Initial Impression / Assessment and Plan / UC Course  ?I have reviewed the triage vital signs  and the nursing notes. ? ?Pertinent labs & imaging results that were available during my care of the patient were reviewed by me and considered in my medical decision making (see chart for details). ? ?Patient i

## 2021-08-22 NOTE — Discharge Instructions (Signed)
Continue to use the Flonase, Atrovent nasal spray, and Zyretc to help with congestion and postnasal drip. ? ?Use the Albuterol inhaler, with the spacer, 1-2 puffs every 4-6 hours as needed for shortness of breath or wheezing. ? ?You can continue to use the Robitussin (plain) for cough symptoms. ? ?Take the Cefdinir twice daily with food for 4 days for your increased sputum production. ? ?Follow-up with your OB/GYN tomorrow as scheduled and discuss the treatment plan with them. ?

## 2021-08-22 NOTE — ED Triage Notes (Addendum)
Patient c/o productive cough. She was here about a month ago but it has gotten worse. Patient denies any fevers.  ?

## 2021-08-23 ENCOUNTER — Other Ambulatory Visit: Payer: Self-pay

## 2021-08-23 ENCOUNTER — Observation Stay
Admission: EM | Admit: 2021-08-23 | Discharge: 2021-08-23 | Disposition: A | Discharge status: Home / Self Care | Payer: Medicaid Other | Attending: Obstetrics and Gynecology | Admitting: Obstetrics and Gynecology

## 2021-08-23 DIAGNOSIS — Z3A36 36 weeks gestation of pregnancy: Secondary | ICD-10-CM | POA: Insufficient documentation

## 2021-08-23 DIAGNOSIS — Z7982 Long term (current) use of aspirin: Secondary | ICD-10-CM | POA: Insufficient documentation

## 2021-08-23 DIAGNOSIS — E119 Type 2 diabetes mellitus without complications: Secondary | ICD-10-CM | POA: Insufficient documentation

## 2021-08-23 DIAGNOSIS — Z794 Long term (current) use of insulin: Secondary | ICD-10-CM | POA: Diagnosis not present

## 2021-08-23 DIAGNOSIS — O368330 Maternal care for abnormalities of the fetal heart rate or rhythm, third trimester, not applicable or unspecified: Principal | ICD-10-CM | POA: Insufficient documentation

## 2021-08-23 DIAGNOSIS — O09523 Supervision of elderly multigravida, third trimester: Secondary | ICD-10-CM | POA: Diagnosis not present

## 2021-08-23 DIAGNOSIS — O99891 Other specified diseases and conditions complicating pregnancy: Secondary | ICD-10-CM | POA: Insufficient documentation

## 2021-08-23 DIAGNOSIS — Z79899 Other long term (current) drug therapy: Secondary | ICD-10-CM | POA: Insufficient documentation

## 2021-08-23 DIAGNOSIS — R059 Cough, unspecified: Secondary | ICD-10-CM | POA: Insufficient documentation

## 2021-08-23 DIAGNOSIS — O24113 Pre-existing diabetes mellitus, type 2, in pregnancy, third trimester: Secondary | ICD-10-CM | POA: Diagnosis not present

## 2021-08-23 DIAGNOSIS — O368390 Maternal care for abnormalities of the fetal heart rate or rhythm, unspecified trimester, not applicable or unspecified: Secondary | ICD-10-CM | POA: Diagnosis present

## 2021-08-23 LAB — CBC
HCT: 40.9 % (ref 36.0–46.0)
Hemoglobin: 13.9 g/dL (ref 12.0–15.0)
MCH: 31.5 pg (ref 26.0–34.0)
MCHC: 34 g/dL (ref 30.0–36.0)
MCV: 92.7 fL (ref 80.0–100.0)
Platelets: 190 10*3/uL (ref 150–400)
RBC: 4.41 MIL/uL (ref 3.87–5.11)
RDW: 14.2 % (ref 11.5–15.5)
WBC: 7.1 10*3/uL (ref 4.0–10.5)
nRBC: 0 % (ref 0.0–0.2)

## 2021-08-23 LAB — GLUCOSE, CAPILLARY
Glucose-Capillary: 78 mg/dL (ref 70–99)
Glucose-Capillary: 160 mg/dL — ABNORMAL HIGH (ref 70–99)

## 2021-08-23 LAB — CHLAMYDIA/NGC RT PCR (ARMC ONLY)
Chlamydia Tr: NOT DETECTED
N gonorrhoeae: NOT DETECTED

## 2021-08-23 LAB — RAPID HIV SCREEN (HIV 1/2 AB+AG)
HIV 1/2 Antibodies: NONREACTIVE
HIV-1 P24 Antigen - HIV24: NONREACTIVE

## 2021-08-23 LAB — GROUP B STREP BY PCR: Group B strep by PCR: POSITIVE — AB

## 2021-08-23 MED ORDER — GUAIFENESIN ER 600 MG PO TB12: 600.0000 mg | ORAL_TABLET | Freq: Two times a day (BID) | ORAL | Status: DC

## 2021-08-23 MED ORDER — GUAIFENESIN-CODEINE 100-10 MG/5ML PO SOLN
5.0000 mL | Freq: Four times a day (QID) | ORAL | Status: DC | PRN
  Administered 2021-08-23: 5 mL via ORAL
  Filled 2021-08-23: qty 5

## 2021-08-23 MED ORDER — INSULIN ASPART 100 UNIT/ML IJ SOLN: 32.0000 [IU] | Freq: Three times a day (TID) | INTRAMUSCULAR | Status: DC

## 2021-08-23 MED ORDER — INSULIN ASPART 100 UNIT/ML IJ SOLN
0.0000 [IU] | Freq: Three times a day (TID) | INTRAMUSCULAR | Status: DC
  Administered 2021-08-23: 3 [IU] via SUBCUTANEOUS
  Filled 2021-08-23: qty 1

## 2021-08-23 NOTE — Discharge Summary (Signed)
Alexa Rosales is a 36 y.o. female. She is at 41w0dgestation. Patient's last menstrual period was 12/07/2020. ?Estimated Date of Delivery: 09/20/21 ? ?[facility]  ? ?Current pregnancy complicated by:  ?Maternal Age >>36yo ?? Age at delivery: 36 ?? Genetic Screening: Negative ?? ASA at 12 weeks-patient is aware ?2. Previous C/S x1 ?? C/s done for: FTP, macrosomia  ?? '[]'$  Request for records sent:  ?? Delivery preference:Desires 3rd VBAC ?3. Obesity BMI: 35.35 ?? Baseline labs: 03/05/21 ?? Early 1 hour GTT: (not done random glucose 214) ?? P/C ratio: 79  ?? CMP: within normal limits  ?? A1c: 7.9 ?4.H/o mental health diagnoses ?? Dx: Anxiety-per patient, no formal DX ?5. Herpes ?? Prophylaxis at 36 weeks ?6. Preexisting DM - dx at NOB ?? Labs: ?? 03/05/2021 A1C 7.9 ?? 04/29/21 A1C 7.3 ?? Referrals: ?? Lifestyles referral: completed  ?? [facility] - managing insulin  ?? Fetal Echo - referral placed by MFM  ?? No further prenatal cardiac follow-up is required unless any new cardiac concerns are noted.  ?? No fetal cardiac indication to alter newborn care. ?? No postnatal cardiology consultation or echocardiogram is indicated at this time. ?? Medications: ?? 06/24/2021 - Lantus 30 units, Lispro 32 units with meals per OB Endocrinology  ? ?Chief complaint: sent from office due to fetal heart rate change during NST. Also having severe cough, dx and being tx for bronchitis, seen in urgent care yesterday.  ? ? ?S: Resting comfortably. no CTX, no VB.no LOF,  Active fetal movement. Frequent productive cough, some urinary incontinence, Denies: HA, visual changes, SOB, or RUQ/epigastric pain ? ?Maternal Medical History:  ? ?Past Medical History:  ?Diagnosis Date  ? [place]   ? History of anemia   ? ? ?Past Surgical History:  ?Procedure Laterality Date  ? CESAREAN SECTION    ? HERNIA REPAIR    ? WISDOM TOOTH EXTRACTION    ? ? ?Allergies  ?Allergen  Reactions  ? Sulfa Antibiotics Other (See Comments)  ? ? ?Prior to Admission medications   ?Medication Sig Start Date End Date Taking? Authorizing Provider  ?albuterol (VENTOLIN HFA) 108 (90 Base) MCG/ACT inhaler Inhale 2 puffs into the lungs every 4 (four) hours as needed. 08/22/21  Yes RMargarette Canada NP  ?aspirin 81 MG EC tablet Take 81 mg by mouth daily.   Yes [provider]  ?cefdinir (OMNICEF) 300 MG capsule Take 1 capsule (300 mg total) by mouth 2 (two) times daily for 7 days. 08/22/21 08/29/21 Yes RMargarette Canada NP  ?cetirizine (ZYRTEC) 5 MG tablet Take 5 mg by mouth daily.   Yes [provider]  ?Cyanocobalamin (VITAMIN B 12 PO) Take 1,000 mcg by mouth daily at 6 (six) AM.   Yes [provider]  ?fluticasone (FLONASE) 50 MCG/ACT nasal spray Place 1-2 sprays into both nostrils daily as needed for allergies or rhinitis.   Yes [provider]  ?insulin glargine (LANTUS SOLOSTAR) 100 UNIT/ML Solostar Pen Inject 30 Units into the skin at bedtime. 06/14/21 09/12/21 Yes [provider]  ?insulin lispro (HUMALOG) 100 UNIT/ML injection Inject 28 Units into the skin 3 (three) times daily before meals.   Yes [provider]  ?ipratropium (ATROVENT) 0.06 % nasal spray Place 2 sprays into both nostrils 4 (four) times daily. 08/01/21  Yes ELaurene FootmanB, PA-C  ?omeprazole (PRILOSEC) 20 MG capsule Take 20 mg by mouth as needed.   Yes [provider]  ?Prenatal 28-0.8 MG TABS Take 1 tablet by mouth daily.   Yes [provider]  ?Spacer/Aero-Holding Chambers (AEROCHAMBER MV) inhaler Use as instructed 08/22/21  Yes Margarette Canada, NP  ?ferrous sulfate 325 (65 FE) MG tablet Take 325 mg by mouth daily with breakfast.  02/15/20  [provider]  ? ? ? ? ?Social History: She  reports that she has never smoked. She has never used smokeless tobacco. She reports that she does not currently use alcohol. She reports that she does not use drugs. ? ?Family History:  family history includes Asthma in her sister; Breast cancer in her maternal aunt and paternal grandmother; Colon cancer in her maternal grandmother; Diabetes in her maternal grandmother and mother; Hypertension in her mother; Sarcoidosis in her father.  ? ?Review of Systems: A full review of systems was performed and negative except as noted in the HPI.   ? ? ?O: ? BP 130/84 (BP Location: Right Arm)   Pulse 81   Temp 98.2 ?F (36.8 ?C) (Oral)   Resp 18   LMP 12/07/2020  ?No results found for this or any previous visit (from the past 48 hour(s)).  ? ?Constitutional: NAD, AAOx3  ?HE/ENT: extraocular movements grossly intact, moist mucous membranes ?CV: RRR ?PULM: nl respiratory effort, CTABL     ?Abd: gravid, non-tender, non-distended, soft      ?Ext: Non-tender, Nonedematous   ?Psych: mood appropriate, speech normal ?Pelvic: SVE: 1cm/thick, soft, fetal presenting part OOP.  ? ?Fetal  monitoring: Cat I Appropriate for GA ?Baseline: 145bpm ?Variability: moderate, with periods of min variability noted after given codeine cough syrup dose.  ?Accelerations: present x >2 ?Decelerations absent ?Time: 4+ hours ? ? ? ?A/P: 36 y.o. 17w0dhere for antenatal surveillance for fetal heart rate Decel in office NST, DM on insulin.  ? ? ?Labor: not present.  ?Fetal Wellbeing: Reassuring Cat 1 tracing. ?Reactive NST  ?D/c home stable, precautions reviewed, follow-up as scheduled.  ? ? ?RFrancetta Found CNM ?08/23/2021  ?2:15 PM ? ?

## 2021-08-23 NOTE — Progress Notes (Signed)
Pt discharged home per [name], CNM order.  Pt stable and ambulatory and an After Visit Summary was printed and given to the patient. Discharge education completed with patient/family including follow up instructions, appointments 2 x weekly, and medication list. Pt received labor and bleeding precautions. Patient able to verbalize understanding, all questions fully answered upon discharge. Patient instructed to return to ED, call 911, or call MD for any changes in condition. Pt discharged home via personal vehicle.  ? ?

## 2021-08-23 NOTE — OB Triage Note (Signed)
Pt Alexa Rosales 36 y.o. presents to labor and delivery triage for an NST. Pt was seen in [facility] office today, had an NST and a possible deceleration, brought to L&D for monitoring.  Pt is a [id number] at [redacted]w[redacted]d. Pt denies signs and symptons consistent with rupture of membranes or active vaginal bleeding. Pt reports irregular contractions and states positive fetal movement. Pt reports having a cough for the pass month and was seen at an Urgent care over the weekend where she as diagnosed with bronchitis and was started on antibiotics. External FM and TOCO applied to non-tender abdomen and assessing. Initial FHR 140 . Vital signs obtained and within normal limits. Provider notified of pt. ? ?

## 2021-08-23 NOTE — Progress Notes (Signed)
Reviewed office tracing with Dr Glennon Mac, Pt needs prolonged monitoring x 4hrs due to decel noted on NST. Carb modified diet ordered and insulin orders placed. CBG ordered now and 2hr PP.  ? ? ?FHR 145bpm, mod variability, + acccels, no decels ?TOCO: irreg mild UCs.  ? ?Francetta Found, CNM ?08/23/2021  ?2:17 PM ? ?

## 2021-08-24 LAB — RPR: RPR Ser Ql: NONREACTIVE

## 2021-08-25 ENCOUNTER — Observation Stay
Admission: EM | Admit: 2021-08-25 | Discharge: 2021-08-25 | Disposition: A | Discharge status: Home / Self Care | Payer: Medicaid Other

## 2021-08-25 ENCOUNTER — Encounter: Payer: Self-pay | Admitting: Obstetrics and Gynecology

## 2021-08-25 ENCOUNTER — Other Ambulatory Visit: Payer: Self-pay

## 2021-08-25 DIAGNOSIS — E119 Type 2 diabetes mellitus without complications: Secondary | ICD-10-CM | POA: Insufficient documentation

## 2021-08-25 DIAGNOSIS — Z3A36 36 weeks gestation of pregnancy: Secondary | ICD-10-CM | POA: Insufficient documentation

## 2021-08-25 DIAGNOSIS — O99213 Obesity complicating pregnancy, third trimester: Secondary | ICD-10-CM | POA: Diagnosis not present

## 2021-08-25 DIAGNOSIS — Z6836 Body mass index (BMI) 36.0-36.9, adult: Secondary | ICD-10-CM | POA: Insufficient documentation

## 2021-08-25 DIAGNOSIS — Z794 Long term (current) use of insulin: Secondary | ICD-10-CM | POA: Diagnosis not present

## 2021-08-25 DIAGNOSIS — Z7982 Long term (current) use of aspirin: Secondary | ICD-10-CM | POA: Insufficient documentation

## 2021-08-25 DIAGNOSIS — O24113 Pre-existing diabetes mellitus, type 2, in pregnancy, third trimester: Secondary | ICD-10-CM | POA: Diagnosis not present

## 2021-08-25 DIAGNOSIS — O139 Gestational [pregnancy-induced] hypertension without significant proteinuria, unspecified trimester: Secondary | ICD-10-CM | POA: Diagnosis present

## 2021-08-25 DIAGNOSIS — O09523 Supervision of elderly multigravida, third trimester: Secondary | ICD-10-CM | POA: Insufficient documentation

## 2021-08-25 DIAGNOSIS — O479 False labor, unspecified: Secondary | ICD-10-CM | POA: Diagnosis present

## 2021-08-25 DIAGNOSIS — E669 Obesity, unspecified: Secondary | ICD-10-CM | POA: Diagnosis not present

## 2021-08-25 DIAGNOSIS — O4703 False labor before 37 completed weeks of gestation, third trimester: Secondary | ICD-10-CM | POA: Diagnosis present

## 2021-08-25 DIAGNOSIS — O133 Gestational [pregnancy-induced] hypertension without significant proteinuria, third trimester: Secondary | ICD-10-CM | POA: Insufficient documentation

## 2021-08-25 LAB — COMPREHENSIVE METABOLIC PANEL
ALT: 17 U/L (ref 0–44)
AST: 21 U/L (ref 15–41)
Albumin: 2.9 g/dL — ABNORMAL LOW (ref 3.5–5.0)
Alkaline Phosphatase: 409 U/L — ABNORMAL HIGH (ref 38–126)
Anion gap: 7 (ref 5–15)
BUN: 10 mg/dL (ref 6–20)
CO2: 19 mmol/L — ABNORMAL LOW (ref 22–32)
Calcium: 9.4 mg/dL (ref 8.9–10.3)
Chloride: 112 mmol/L — ABNORMAL HIGH (ref 98–111)
Creatinine, Ser: 0.63 mg/dL (ref 0.44–1.00)
GFR, Estimated: >60 mL/min (ref >60)
Glucose, Bld: 129 mg/dL — ABNORMAL HIGH (ref 70–99)
Potassium: 4.1 mmol/L (ref 3.5–5.1)
Sodium: 138 mmol/L (ref 135–145)
Total Bilirubin: 0.7 mg/dL (ref 0.3–1.2)
Total Protein: 6.1 g/dL — ABNORMAL LOW (ref 6.5–8.1)

## 2021-08-25 LAB — CBC
HCT: 43.5 % (ref 36.0–46.0)
Hemoglobin: 14.8 g/dL (ref 12.0–15.0)
MCH: 31.7 pg (ref 26.0–34.0)
MCHC: 34 g/dL (ref 30.0–36.0)
MCV: 93.1 fL (ref 80.0–100.0)
Platelets: 204 10*3/uL (ref 150–400)
RBC: 4.67 MIL/uL (ref 3.87–5.11)
RDW: 14.2 % (ref 11.5–15.5)
WBC: 11.3 10*3/uL — ABNORMAL HIGH (ref 4.0–10.5)
nRBC: 0 % (ref 0.0–0.2)

## 2021-08-25 LAB — URINALYSIS, COMPLETE (UACMP) WITH MICROSCOPIC
Bacteria, UA: NONE SEEN
Bilirubin Urine: NEGATIVE
Glucose, UA: 50 mg/dL — AB
Hgb urine dipstick: NEGATIVE
Ketones, ur: 5 mg/dL — AB
Nitrite: NEGATIVE
Protein, ur: NEGATIVE mg/dL
Specific Gravity, Urine: 1.024 (ref 1.005–1.030)
pH: 5 (ref 5.0–8.0)

## 2021-08-25 LAB — PROTEIN / CREATININE RATIO, URINE
Creatinine, Urine: 172 mg/dL
Protein Creatinine Ratio: 0.1 mg/mg{Cre} (ref 0.00–0.15)
Total Protein, Urine: 18 mg/dL

## 2021-08-25 LAB — WET PREP, GENITAL
Clue Cells Wet Prep HPF POC: NONE SEEN
Sperm: NONE SEEN
Trich, Wet Prep: NONE SEEN
WBC, Wet Prep HPF POC: >=10 — AB (ref <10)
Yeast Wet Prep HPF POC: NONE SEEN

## 2021-08-25 MED ORDER — CALCIUM CARBONATE ANTACID 500 MG PO CHEW: 2.0000 | CHEWABLE_TABLET | ORAL | Status: DC | PRN

## 2021-08-25 MED ORDER — ACETAMINOPHEN 500 MG PO TABS: 1000.0000 mg | ORAL_TABLET | Freq: Four times a day (QID) | ORAL | Status: DC | PRN

## 2021-08-25 NOTE — Progress Notes (Signed)
Reviewed discharge instructions with pt. Discussed hypertension, and s/s to report to MD. Discussed F/U on Friday with [facility] for NST and discussion for IOL scheduled for 08/31/21. Pt verbalizes understanding.  ?

## 2021-08-25 NOTE — OB Triage Note (Signed)
Pt G7P5 reports to L&D with reports of contractions q5 mins. States that she is having mucousy discharge with scant blood noted. States positive fetal movement. Hx of gestational HTN and type 2 diabetes. Vitals signs obtained and within normal limits with exception of BP - hypertensive 140/84. ?

## 2021-08-25 NOTE — Discharge Summary (Signed)
Alexa Rosales is a 36 y.o. female. She is at 51w2dgestation. Patient's last menstrual period was 12/07/2020. ?Estimated Date of Delivery: 09/20/21 ? ?[facility] ? ?Chief complaint: contractions  ? ?HPI: ARhenpresents to L&D with complaints of contractions every 5 minutes and mucus discharge.  Denies LOF or vaginal bleeding.  Endorses good fetal movement.   ? ?Factors complicating pregnancy: ?Preexisting DM - insulin dependent  ?AMA ?Previous LTCS followed by 2 successful VBACs  ?Obesity in pregnancy  ?HSV - taking valtrex for suppression  ?History of anxiety  ? ?S: Resting comfortably. Irregular contractions, no VB.no LOF,  Active fetal movement.  ? ?Maternal Medical History:  ?Past Medical Hx:  has a past medical history of Abnormal uterine bleeding (AUB) (12/29/2015), Diabetes mellitus without complication (HAguila, History of anemia, and Menorrhagia (11/13/2012).   ? ?Past Surgical Hx:  has a past surgical history that includes Cesarean section; Hernia repair; and Wisdom tooth extraction.  ? ?Allergies  ?Allergen Reactions  ? Sulfa Antibiotics Other (See Comments)  ?  ? ?Prior to Admission medications   ?Medication Sig Start Date End Date Taking? Authorizing Provider  ?albuterol (VENTOLIN HFA) 108 (90 Base) MCG/ACT inhaler Inhale 2 puffs into the lungs every 4 (four) hours as needed. 08/22/21   RMargarette Canada NP  ?aspirin 81 MG EC tablet Take 81 mg by mouth daily.    [provider]  ?cefdinir (OMNICEF) 300 MG capsule Take 1 capsule (300 mg total) by mouth 2 (two) times daily for 7 days. 08/22/21 08/29/21  RMargarette Canada NP  ?cetirizine (ZYRTEC) 5 MG tablet Take 5 mg by mouth daily.    [provider]  ?Cyanocobalamin (VITAMIN B 12 PO) Take 1,000 mcg by mouth daily at 6 (six) AM.    [provider]  ?fluticasone (FLONASE) 50 MCG/ACT nasal spray Place 1-2 sprays into both nostrils daily as needed for allergies or rhinitis.    [provider]  ?insulin  glargine (LANTUS SOLOSTAR) 100 UNIT/ML Solostar Pen Inject 30 Units into the skin at bedtime. 06/14/21 09/12/21  [provider]  ?insulin lispro (HUMALOG) 100 UNIT/ML injection Inject 28 Units into the skin 3 (three) times daily before meals.    [provider]  ?ipratropium (ATROVENT) 0.06 % nasal spray Place 2 sprays into both nostrils 4 (four) times daily. 08/01/21   EDanton Clap PA-C  ?omeprazole (PRILOSEC) 20 MG capsule Take 20 mg by mouth as needed.    [provider]  ?Prenatal 28-0.8 MG TABS Take 1 tablet by mouth daily.    [provider]  ?Spacer/Aero-Holding Chambers (AEROCHAMBER MV) inhaler Use as instructed 08/22/21   RMargarette Canada NP  ?valACYclovir (VALTREX) 500 MG tablet Take 500 mg by mouth 2 (two) times daily. 08/23/21   [provider]  ?ferrous sulfate 325 (65 FE) MG tablet Take 325 mg by mouth daily with breakfast.  02/15/20  [provider]  ? ? ?Social History: She  reports that she has never smoked. She has never used smokeless tobacco. She reports that she does not currently use alcohol. She reports that she does not use drugs. ? ?Family History: family history includes Asthma in her sister; Breast cancer in her maternal aunt and paternal grandmother; Colon cancer in her maternal grandmother; Diabetes in her maternal grandmother and mother; Hypertension in her mother; Sarcoidosis in her father.  ? ?Review of Systems: A full review of systems was performed and negative except as noted in the HPI.   ? ?  O: ? BP 137/86   Pulse 97   Temp 98.2 ?F (36.8 ?C) (Oral)   Resp 18   Ht '5\' 6"'$  (1.676 m)   Wt 103.4 kg   LMP 12/07/2020   BMI 36.80 kg/m?  ?Results for orders placed or performed during the hospital encounter of 08/25/21 (from the past 48 hour(s))  ?Wet prep, genital  ? Collection Time: 08/25/21  3:15 PM  ?Result Value Ref Range  ? Yeast Wet Prep HPF POC NONE SEEN NONE SEEN  ? Trich, Wet Prep NONE SEEN NONE SEEN  ? Clue Cells Wet Prep HPF  POC NONE SEEN NONE SEEN  ? WBC, Wet Prep HPF POC >=10 (A) <10  ? Sperm NONE SEEN   ?Protein / creatinine ratio, urine  ? Collection Time: 08/25/21  3:15 PM  ?Result Value Ref Range  ? Creatinine, Urine 172 mg/dL  ? Total Protein, Urine 18 mg/dL  ? Protein Creatinine Ratio 0.10 0.00 - 0.15 mg/mg[Cre]  ?Urinalysis, Complete w Microscopic Urine, Clean Catch  ? Collection Time: 08/25/21  3:15 PM  ?Result Value Ref Range  ? Color, Urine YELLOW (A) YELLOW  ? APPearance HAZY (A) CLEAR  ? Specific Gravity, Urine 1.024 1.005 - 1.030  ? pH 5.0 5.0 - 8.0  ? Glucose, UA 50 (A) NEGATIVE mg/dL  ? Hgb urine dipstick NEGATIVE NEGATIVE  ? Bilirubin Urine NEGATIVE NEGATIVE  ? Ketones, ur 5 (A) NEGATIVE mg/dL  ? Protein, ur NEGATIVE NEGATIVE mg/dL  ? Nitrite NEGATIVE NEGATIVE  ? Leukocytes,Ua SMALL (A) NEGATIVE  ? RBC / HPF 0-5 0 - 5 RBC/hpf  ? WBC, UA 11-20 0 - 5 WBC/hpf  ? Bacteria, UA NONE SEEN NONE SEEN  ? Squamous Epithelial / LPF 0-5 0 - 5  ? Mucus PRESENT   ?Comprehensive metabolic panel  ? Collection Time: 08/25/21  3:15 PM  ?Result Value Ref Range  ? Sodium 138 135 - 145 mmol/L  ? Potassium 4.1 3.5 - 5.1 mmol/L  ? Chloride 112 (H) 98 - 111 mmol/L  ? CO2 19 (L) 22 - 32 mmol/L  ? Glucose, Bld 129 (H) 70 - 99 mg/dL  ? BUN 10 6 - 20 mg/dL  ? Creatinine, Ser 0.63 0.44 - 1.00 mg/dL  ? Calcium 9.4 8.9 - 10.3 mg/dL  ? Total Protein 6.1 (L) 6.5 - 8.1 g/dL  ? Albumin 2.9 (L) 3.5 - 5.0 g/dL  ? AST 21 15 - 41 U/L  ? ALT 17 0 - 44 U/L  ? Alkaline Phosphatase 409 (H) 38 - 126 U/L  ? Total Bilirubin 0.7 0.3 - 1.2 mg/dL  ? GFR, Estimated >60 >60 mL/min  ? Anion gap 7 5 - 15  ?CBC  ? Collection Time: 08/25/21  3:15 PM  ?Result Value Ref Range  ? WBC 11.3 (H) 4.0 - 10.5 K/uL  ? RBC 4.67 3.87 - 5.11 MIL/uL  ? Hemoglobin 14.8 12.0 - 15.0 g/dL  ? HCT 43.5 36.0 - 46.0 %  ? MCV 93.1 80.0 - 100.0 fL  ? MCH 31.7 26.0 - 34.0 pg  ? MCHC 34.0 30.0 - 36.0 g/dL  ? RDW 14.2 11.5 - 15.5 %  ? Platelets 204 150 - 400 K/uL  ? nRBC 0.0 0.0 - 0.2 %  ?   ? ?Constitutional: NAD, AAOx3  ?HE/ENT: extraocular movements grossly intact, moist mucous membranes ?CV: RRR ?PULM: nl respiratory effort ?Abd: gravid, non-tender, non-distended, soft  ?Ext: Non-tender, Non-edematous ?SVE: Dilation: 1 ?Effacement (%): 50 ?Cervical Position: Posterior ?Presentation: Vertex ?  Exam by:: Maryfrances Bunnell, RN  ? ?Fetal Monitor: ?Baseline: 145 bpm ?Variability: moderate ?Accels: Present ?Decels: none ?Toco: irregular, mild contractions  ? ?Category: I ?NST: Reactive  ? ? ?Assessment: 36 y.o. 35w2dhere for antenatal surveillance during pregnancy. ? ?Principle diagnosis: Uterine contractions during pregnancy [O47.9], Gestational Hypertension  ? ?Plan: ?Labor: not present.  ?NST reviewed - reactive  ?Fetal Wellbeing: Reassuring Cat 1 tracing. ?Preeclampsia labs WNL  ?Blood pressures reviewed with Dr. JGlennon Mac?Several mild range blood pressures noted along with overall normal blood pressures.  Severe range blood pressure was noted as patient was actively breaking and eating crab labs.  Blood pressures were normal to mild range when blood pressure was not taken during active movement.  ?She has an induction of labor scheduled at 38 weeks on 09/06/2021.  Will move to 08/31/2021 at 0800 d/t intermittently mild range blood pressures now complicating pregnancy.   ?Preeclampsia precautions and warning signs reviewed.  ?D/c home stable, precautions reviewed, follow-up as scheduled this Friday.   ? ?----- ?ADrinda Butts CNM ?Certified Nurse Midwife ?[facility] ?[facility] [place] ? ? ?

## 2021-08-25 NOTE — Discharge Instructions (Signed)
Call provider or return to birthplace with: ? ?1. Regular contractions ?2. Leaking of fluid from your vagina ?3. Vaginal bleeding: Bright red or heavy like a period ?4. Decreased Fetal movement  ?

## 2021-08-27 ENCOUNTER — Encounter: Payer: Self-pay | Admitting: Obstetrics and Gynecology

## 2021-08-27 ENCOUNTER — Inpatient Hospital Stay
Admission: EM | Admit: 2021-08-27 | Discharge: 2021-08-29 | DRG: 796 | Disposition: A | Discharge status: Home / Self Care | Payer: Medicaid Other | Attending: Certified Nurse Midwife | Admitting: Certified Nurse Midwife

## 2021-08-27 ENCOUNTER — Other Ambulatory Visit: Payer: Self-pay

## 2021-08-27 DIAGNOSIS — Z794 Long term (current) use of insulin: Secondary | ICD-10-CM | POA: Diagnosis not present

## 2021-08-27 DIAGNOSIS — O9832 Other infections with a predominantly sexual mode of transmission complicating childbirth: Secondary | ICD-10-CM | POA: Diagnosis present

## 2021-08-27 DIAGNOSIS — O24919 Unspecified diabetes mellitus in pregnancy, unspecified trimester: Principal | ICD-10-CM | POA: Diagnosis present

## 2021-08-27 DIAGNOSIS — Z7982 Long term (current) use of aspirin: Secondary | ICD-10-CM | POA: Diagnosis not present

## 2021-08-27 DIAGNOSIS — E119 Type 2 diabetes mellitus without complications: Secondary | ICD-10-CM | POA: Diagnosis present

## 2021-08-27 DIAGNOSIS — Z302 Encounter for sterilization: Secondary | ICD-10-CM | POA: Diagnosis not present

## 2021-08-27 DIAGNOSIS — O34219 Maternal care for unspecified type scar from previous cesarean delivery: Secondary | ICD-10-CM | POA: Diagnosis present

## 2021-08-27 DIAGNOSIS — O9081 Anemia of the puerperium: Secondary | ICD-10-CM | POA: Diagnosis not present

## 2021-08-27 DIAGNOSIS — A6000 Herpesviral infection of urogenital system, unspecified: Secondary | ICD-10-CM | POA: Diagnosis present

## 2021-08-27 DIAGNOSIS — Z3A36 36 weeks gestation of pregnancy: Secondary | ICD-10-CM | POA: Diagnosis not present

## 2021-08-27 DIAGNOSIS — O134 Gestational [pregnancy-induced] hypertension without significant proteinuria, complicating childbirth: Secondary | ICD-10-CM | POA: Diagnosis present

## 2021-08-27 DIAGNOSIS — O99214 Obesity complicating childbirth: Secondary | ICD-10-CM | POA: Diagnosis present

## 2021-08-27 DIAGNOSIS — O2412 Pre-existing diabetes mellitus, type 2, in childbirth: Secondary | ICD-10-CM | POA: Diagnosis present

## 2021-08-27 LAB — TYPE AND SCREEN
ABO/RH(D): O POS
Antibody Screen: NEGATIVE

## 2021-08-27 LAB — CBC
HCT: 44.1 % (ref 36.0–46.0)
Hemoglobin: 15.2 g/dL — ABNORMAL HIGH (ref 12.0–15.0)
MCH: 31.7 pg (ref 26.0–34.0)
MCHC: 34.5 g/dL (ref 30.0–36.0)
MCV: 91.9 fL (ref 80.0–100.0)
Platelets: 239 10*3/uL (ref 150–400)
RBC: 4.8 MIL/uL (ref 3.87–5.11)
RDW: 14 % (ref 11.5–15.5)
WBC: 11.1 10*3/uL — ABNORMAL HIGH (ref 4.0–10.5)
nRBC: 0 % (ref 0.0–0.2)

## 2021-08-27 LAB — GLUCOSE, CAPILLARY: Glucose-Capillary: 119 mg/dL — ABNORMAL HIGH (ref 70–99)

## 2021-08-27 MED ORDER — TERBUTALINE SULFATE 1 MG/ML IJ SOLN: 0.2500 mg | Freq: Once | INTRAMUSCULAR | Status: DC | PRN

## 2021-08-27 MED ORDER — LIDOCAINE HCL (PF) 1 % IJ SOLN: 30.0000 mL | INTRAMUSCULAR | Status: DC | PRN

## 2021-08-27 MED ORDER — OXYTOCIN-SODIUM CHLORIDE 30-0.9 UT/500ML-% IV SOLN
2.5000 [IU]/h | INTRAVENOUS | Status: DC
Start: 2021-08-27 — End: 2021-08-27

## 2021-08-27 MED ORDER — ALBUTEROL SULFATE (2.5 MG/3ML) 0.083% IN NEBU: 2.5000 mg | INHALATION_SOLUTION | RESPIRATORY_TRACT | Status: DC | PRN

## 2021-08-27 MED ORDER — OXYCODONE-ACETAMINOPHEN 5-325 MG PO TABS: 2.0000 | ORAL_TABLET | ORAL | Status: DC | PRN

## 2021-08-27 MED ORDER — OXYTOCIN 10 UNIT/ML IJ SOLN
INTRAMUSCULAR | Status: AC
  Filled 2021-08-27: qty 2

## 2021-08-27 MED ORDER — PRENATAL MULTIVITAMIN CH
1.0000 | ORAL_TABLET | Freq: Every day | ORAL | Status: DC
  Administered 2021-08-29: 1 via ORAL
  Filled 2021-08-27: qty 1

## 2021-08-27 MED ORDER — DIBUCAINE (PERIANAL) 1 % EX OINT
1.0000 "application " | TOPICAL_OINTMENT | CUTANEOUS | Status: DC | PRN
  Administered 2021-08-29: 1 via RECTAL
  Filled 2021-08-27 (×2): qty 28

## 2021-08-27 MED ORDER — SENNOSIDES-DOCUSATE SODIUM 8.6-50 MG PO TABS
2.0000 | ORAL_TABLET | Freq: Every day | ORAL | Status: DC
  Administered 2021-08-28 – 2021-08-29 (×2): 2 via ORAL
  Filled 2021-08-27 (×2): qty 2

## 2021-08-27 MED ORDER — OXYCODONE-ACETAMINOPHEN 5-325 MG PO TABS: 1.0000 | ORAL_TABLET | ORAL | Status: DC | PRN

## 2021-08-27 MED ORDER — AMMONIA AROMATIC IN INHA
RESPIRATORY_TRACT | Status: AC
  Filled 2021-08-27: qty 10

## 2021-08-27 MED ORDER — DIPHENHYDRAMINE HCL 25 MG PO CAPS
25.0000 mg | ORAL_CAPSULE | Freq: Four times a day (QID) | ORAL | Status: DC | PRN
  Administered 2021-08-28: 25 mg via ORAL
  Filled 2021-08-27: qty 1

## 2021-08-27 MED ORDER — WITCH HAZEL-GLYCERIN EX PADS
1.0000 "application " | MEDICATED_PAD | CUTANEOUS | Status: DC | PRN
  Administered 2021-08-29: 1 via TOPICAL
  Filled 2021-08-27 (×2): qty 100

## 2021-08-27 MED ORDER — MISOPROSTOL 200 MCG PO TABS
ORAL_TABLET | ORAL | Status: AC
  Filled 2021-08-27: qty 4

## 2021-08-27 MED ORDER — PRENATAL 28-0.8 MG PO TABS: 1.0000 | ORAL_TABLET | Freq: Every day | ORAL | Status: DC

## 2021-08-27 MED ORDER — INSULIN REGULAR(HUMAN) IN NACL 100-0.9 UT/100ML-% IV SOLN
INTRAVENOUS | Status: DC
  Filled 2021-08-27: qty 100

## 2021-08-27 MED ORDER — BENZOCAINE-MENTHOL 20-0.5 % EX AERO
1.0000 "application " | INHALATION_SPRAY | CUTANEOUS | Status: DC | PRN
  Administered 2021-08-29: 1 via TOPICAL
  Filled 2021-08-27: qty 56

## 2021-08-27 MED ORDER — OXYTOCIN BOLUS FROM INFUSION: 333.0000 mL | Freq: Once | INTRAVENOUS | Status: AC

## 2021-08-27 MED ORDER — DEXTROSE IN LACTATED RINGERS 5 % IV SOLN: INTRAVENOUS | Status: DC

## 2021-08-27 MED ORDER — ONDANSETRON HCL 4 MG PO TABS: 4.0000 mg | ORAL_TABLET | ORAL | Status: DC | PRN

## 2021-08-27 MED ORDER — FLUTICASONE PROPIONATE 50 MCG/ACT NA SUSP: 1.0000 | Freq: Every day | NASAL | Status: DC | PRN

## 2021-08-27 MED ORDER — LACTATED RINGERS IV SOLN: INTRAVENOUS | Status: DC

## 2021-08-27 MED ORDER — IPRATROPIUM BROMIDE 0.06 % NA SOLN
2.0000 | Freq: Four times a day (QID) | NASAL | Status: DC
  Administered 2021-08-28 – 2021-08-29 (×7): 2 via NASAL
  Filled 2021-08-27: qty 15

## 2021-08-27 MED ORDER — OXYTOCIN-SODIUM CHLORIDE 30-0.9 UT/500ML-% IV SOLN: 1.0000 m[IU]/min | INTRAVENOUS | Status: DC

## 2021-08-27 MED ORDER — LACTATED RINGERS IV SOLN: 500.0000 mL | INTRAVENOUS | Status: DC | PRN

## 2021-08-27 MED ORDER — LORATADINE 10 MG PO TABS
10.0000 mg | ORAL_TABLET | Freq: Every day | ORAL | Status: DC
  Administered 2021-08-28 – 2021-08-29 (×2): 10 mg via ORAL
  Filled 2021-08-27 (×2): qty 1

## 2021-08-27 MED ORDER — SIMETHICONE 80 MG PO CHEW
80.0000 mg | CHEWABLE_TABLET | ORAL | Status: DC | PRN
  Administered 2021-08-28: 80 mg via ORAL
  Filled 2021-08-27: qty 1

## 2021-08-27 MED ORDER — OXYTOCIN-SODIUM CHLORIDE 30-0.9 UT/500ML-% IV SOLN
INTRAVENOUS | Status: AC
Start: 2021-08-27 — End: 2021-08-27
  Administered 2021-08-27: 333 mL via INTRAVENOUS
  Filled 2021-08-27: qty 500

## 2021-08-27 MED ORDER — COCONUT OIL OIL: 1.0000 "application " | TOPICAL_OIL | Status: DC | PRN

## 2021-08-27 MED ORDER — IBUPROFEN 600 MG PO TABS
600.0000 mg | ORAL_TABLET | Freq: Four times a day (QID) | ORAL | Status: DC
  Administered 2021-08-28 – 2021-08-29 (×6): 600 mg via ORAL
  Filled 2021-08-27 (×6): qty 1

## 2021-08-27 MED ORDER — DEXTROSE 50 % IV SOLN: 0.0000 mL | INTRAVENOUS | Status: DC | PRN

## 2021-08-27 MED ORDER — SOD CITRATE-CITRIC ACID 500-334 MG/5ML PO SOLN: 30.0000 mL | ORAL | Status: DC | PRN

## 2021-08-27 MED ORDER — ACETAMINOPHEN 325 MG PO TABS: 650.0000 mg | ORAL_TABLET | ORAL | Status: DC | PRN

## 2021-08-27 MED ORDER — ACETAMINOPHEN 325 MG PO TABS
650.0000 mg | ORAL_TABLET | ORAL | Status: DC | PRN
  Administered 2021-08-28 – 2021-08-29 (×6): 650 mg via ORAL
  Filled 2021-08-27 (×6): qty 2

## 2021-08-27 MED ORDER — ONDANSETRON HCL 4 MG/2ML IJ SOLN: 4.0000 mg | Freq: Four times a day (QID) | INTRAMUSCULAR | Status: DC | PRN

## 2021-08-27 MED ORDER — MISOPROSTOL 25 MCG QUARTER TABLET: 25.0000 ug | ORAL_TABLET | ORAL | Status: DC | PRN

## 2021-08-27 MED ORDER — LIDOCAINE HCL (PF) 1 % IJ SOLN
INTRAMUSCULAR | Status: AC
  Filled 2021-08-27: qty 30

## 2021-08-27 MED ORDER — ONDANSETRON HCL 4 MG/2ML IJ SOLN: 4.0000 mg | INTRAMUSCULAR | Status: DC | PRN

## 2021-08-27 NOTE — H&P (Signed)
OB History & Physical - obtained after delivery d/t precipitous nature of her arrival and delivery.  History of Present Illness:  Chief Complaint: uterine contractions  HPI:  Louann Hopson is a 36 y.o. [id number] female at 22w4ddated by 7wk [place]  She presents to L&D for uterine contractions and leaking fluid.  Active FM onset of ctx @ 1930 currently every 2-3 minutes LOF  / SROM @ 2206 bloody show present   Pregnancy Issues: 1. Maternal Age >>72yo 2. Previous C/S x1 3. Obesity BMI: 35.35 4. H/o mental health diagnoses 5. Herpes 6. Preexisting DM - dx at NOB   Maternal Medical History:   Past Medical History:  Diagnosis Date   Abnormal uterine bleeding (AUB) 12/29/2015   Diabetes mellitus without complication (HCC)    History of anemia    Menorrhagia 11/13/2012    Past Surgical History:  Procedure Laterality Date   CESAREAN SECTION     HERNIA REPAIR     WISDOM TOOTH EXTRACTION      Allergies  Allergen Reactions   Sulfa Antibiotics Other (See Comments)    Prior to Admission medications   Medication Sig Start Date End Date Taking? Authorizing Provider  albuterol (VENTOLIN HFA) 108 (90 Base) MCG/ACT inhaler Inhale 2 puffs into the lungs every 4 (four) hours as needed. 08/22/21   RMargarette Canada NP  aspirin 81 MG EC tablet Take 81 mg by mouth daily.    [provider]  cefdinir (OMNICEF) 300 MG capsule Take 1 capsule (300 mg total) by mouth 2 (two) times daily for 7 days. 08/22/21 08/29/21  RMargarette Canada NP  cetirizine (ZYRTEC) 5 MG tablet Take 5 mg by mouth daily.    [provider]  Cyanocobalamin (VITAMIN B 12 PO) Take 1,000 mcg by mouth daily at 6 (six) AM.    [provider]  fluticasone (FLONASE) 50 MCG/ACT nasal spray Place 1-2 sprays into both nostrils daily as needed for allergies or rhinitis.    [provider]  insulin glargine (LANTUS SOLOSTAR) 100 UNIT/ML Solostar Pen Inject 30 Units into the skin at bedtime. 06/14/21 09/12/21   [provider]  insulin lispro (HUMALOG) 100 UNIT/ML injection Inject 28 Units into the skin 3 (three) times daily before meals.    [provider]  ipratropium (ATROVENT) 0.06 % nasal spray Place 2 sprays into both nostrils 4 (four) times daily. 08/01/21   EDanton Clap PA-C  omeprazole (PRILOSEC) 20 MG capsule Take 20 mg by mouth as needed.    [provider]  Prenatal 28-0.8 MG TABS Take 1 tablet by mouth daily.    [provider]  Spacer/Aero-Holding Chambers (AEROCHAMBER MV) inhaler Use as instructed 08/22/21   RMargarette Canada NP  valACYclovir (VALTREX) 500 MG tablet Take 500 mg by mouth 2 (two) times daily. 08/23/21   [provider]  ferrous sulfate 325 (65 FE) MG tablet Take 325 mg by mouth daily with breakfast.  02/15/20  [provider]     [place] [facility] [facility]  Social History: She  reports that she has never smoked. She has never used smokeless tobacco. She reports that she does not currently use alcohol. She reports that she does not use drugs.  Family History: family history includes Asthma in her sister; Breast cancer in her maternal aunt and paternal grandmother; Colon cancer in her maternal grandmother; Diabetes in her maternal grandmother and mother; Hypertension in her mother; Sarcoidosis in her father.   Review of Systems:  A full review of systems was performed and negative except as noted in the HPI.     Physical Exam:  Vital Signs: LMP 12/07/2020  General: no acute distress.  HEENT: normocephalic, atraumatic Heart: regular rate & rhythm.  No murmurs/rubs/gallops Lungs: clear to auscultation bilaterally, normal respiratory effort Abdomen: soft, gravid, non-tender;  EFW: 7lb Pelvic:   External: Normal external female genitalia  Cervix: Dilation: Lip/rim / Effacement (%): 100 / Station: Plus 1, 0    Extremities: non-tender, symmetric, mild edema bilaterally.  DTRs: +2  Neurologic: Alert &  oriented x 3.    Results for orders placed or performed during the hospital encounter of 08/27/21 (from the past 24 hour(s))  CBC     Status: Abnormal   Collection Time: 08/27/21 10:01 PM  Result Value Ref Range   WBC 11.1 (H) 4.0 - 10.5 K/uL   RBC 4.80 3.87 - 5.11 MIL/uL   Hemoglobin 15.2 (H) 12.0 - 15.0 g/dL   HCT 44.1 36.0 - 46.0 %   MCV 91.9 80.0 - 100.0 fL   MCH 31.7 26.0 - 34.0 pg   MCHC 34.5 30.0 - 36.0 g/dL   RDW 14.0 11.5 - 15.5 %   Platelets 239 150 - 400 K/uL   nRBC 0.0 0.0 - 0.2 %  Glucose, capillary     Status: Abnormal   Collection Time: 08/27/21 10:23 PM  Result Value Ref Range   Glucose-Capillary 119 (H) 70 - 99 mg/dL    Pertinent Results:  Prenatal Labs: Blood type/Rh O pos  Antibody screen neg  Rubella Immune  Varicella Immune  RPR NR  HBsAg Neg  HIV NR  GC neg  Chlamydia neg  Genetic screening negative  1 hour GTT Pre-existing DM  3 hour GTT   GBS positive   FHT: 145bpm, moderate variability, accels present, prolonged deceleration immediately prior to delivery TOCO: q2-11mn SVE:  Dilation: Lip/rim / Effacement (%): 100 / Station: Plus 1, 0    Cephalic by SVE  [place] Limited  Result Date: 08/02/2021 CLINICAL DATA:  Decreased fetal movement, third trimester, AFI. EXAM: LIMITED OBSTETRIC ULTRASOUND COMPARISON:  None. FINDINGS: Number of Fetuses: 1 Heart Rate:  160 bpm Movement: Yes Presentation: Cephalic Placental Location: Posterior Previa: No Amniotic Fluid (Subjective):  Within normal limits. AFI: 13.2 cm Femur length: 5.71 cm 30 w  0 d MATERNAL FINDINGS: Cervix:  Appears closed measuring 3.6 cm in length. Uterus/Adnexae: No abnormality visualized. IMPRESSION: 1. Single live intrauterine pregnancy with estimated gestational age of 349 weeksand 0 days by femur length measurement. 2. Fetal heart rate measured at 160 beats per minute. 3. AFI within normal limits at 13.2 cm. This exam is performed on an emergent basis and does not comprehensively evaluate  fetal size, dating, or anatomy; follow-up complete OB [place] be considered if further fetal assessment is warranted. Electronically Signed   By: SFranki CabotM.D.   On: 08/02/2021 11:16    Assessment:  ARonnita Pazis a 36y.o. [id number] at 343w4dith active labor and SROM.   Plan:  1. Admit to Labor & Delivery; consents reviewed and obtained  2. Fetal Well being  - Fetal Tracing: Cat II tracing - Group B Streptococcus ppx indicated: yes, delivered precipitously before able to treat with IV antibiotics - Presentation: vertex confirmed by SVE   3. Routine OB: - Prenatal labs reviewed, as above - Rh positive - CBC, T&S, RPR on admit - Clear fluids, saline lock  4. Monitoring of Labor -  Contractions q2-86mn, external toco in place -  Plan for continuous fetal monitoring  -  Maternal pain control as desired; not enough time to treat her pain - Anticipate vaginal delivery  5. Post Partum Planning: - Infant feeding: breastfeeding - Contraception: BTL (consent signed 07/02/21)  DGertie Fey CSantee05/12/23 10:35 PM

## 2021-08-27 NOTE — Discharge Summary (Signed)
Obstetrical Discharge Summary  Patient Name: Alexa Rosales DOB: Feb 25, 1986 MRN: [id number]  Date of Admission: 08/27/2021 Date of Delivery: 08/27/2021 Delivered by: Lucrezia Europe, CNM Date of Discharge: 08/29/2021  Primary OB: [facility]  [id number] last menstrual period was 12/07/2020. EDC Estimated Date of Delivery: 09/20/21 Gestational Age at Delivery: [redacted]w[redacted]d  Antepartum complications:  1. Maternal Age >>37yo 2. Previous C/S x1 3. Obesity BMI: 35.35 4. H/o mental health diagnoses 5. Herpes 6. Preexisting DM - dx at NOB Admitting Diagnosis: uterine contractions Secondary Diagnosis: precipitous delivery, VBAC Patient Active Problem List   Diagnosis Date Noted   Diabetes in pregnancy 08/27/2021   Uterine contractions during pregnancy 08/25/2021   Gestational hypertension without significant proteinuria 08/25/2021   Supervision of high risk pregnancy in third trimester 08/22/2021   AMA (advanced maternal age) multigravida 35+, third trimester 08/22/2021   Preexisting diabetes complicating pregnancy, antepartum 05/06/2021   Obesity in pregnancy, antepartum 05/06/2021   Chronic rhinitis 07/23/2018   Family history of breast cancer in female 11/15/2011   Previous cesarean delivery affecting pregnancy, antepartum 11/15/2011   Allergic rhinitis 08/25/2011   Genital herpes 08/25/2011    Augmentation: AROM Complications: None Intrapartum complications/course: AAngiarrived reporting contractions and rectal pressure. She was 9.5/100/+1. She had urge to push. AROM with thick meconium. She quickly delivered viable female infant at 331w4dn one push. Apgars 8/8. Only laceration is a small "skid mark," no need to repair. Date of Delivery: 08/27/2021 Delivered By: DaLucrezia EuropeCNM Delivery Type: vaginal birth after cesarean (VBAC) Anesthesia: epidural Placenta: spontaneous Laceration: small vaginal "skid mark" Episiotomy: none Newborn Data: Live born girl Birth  Weight:  2830g (6lb3.8oz) APGAR: 8, 8   Newborn Delivery   Birth date/time: 08/27/2021 22:12:00 Delivery type: Vaginal, Spontaneous     Postpartum Procedures:  BTL 08/28/2021 [place]:     08/29/2021    8:20 AM  EdFlavia Shipperostnatal Depression Scale Screening Tool  I have been able to laugh and see the funny side of things. 0  I have looked forward with enjoyment to things. 0  I have blamed myself unnecessarily when things went wrong. 0  I have been anxious or worried for no good reason. 0  I have felt scared or panicky for no good reason. 0  Things have been getting on top of me. 1  I have been so unhappy that I have had difficulty sleeping. 0  I have felt sad or miserable. 0  I have been so unhappy that I have been crying. 0  The thought of harming myself has occurred to me. 0  Edinburgh Postnatal Depression Scale Total 1     Post partum course:  Patient had an uncomplicated postpartum course.  By time of discharge on PPD#2, her pain was controlled on oral pain medications; she had appropriate lochia and was ambulating, voiding without difficulty and tolerating regular diet.  She was deemed stable for discharge to home.    Discharge Physical Exam:  BP 125/90 (BP Location: Right Arm)   Pulse 98   Temp 98.3 F (36.8 C) (Oral)   Resp 18   Ht '5\' 6"'$  (1.676 m)   Wt 103.4 kg   LMP 12/07/2020   SpO2 100%   Breastfeeding Unknown   BMI 36.80 kg/m   General: NAD CV: RRR Pulm: CTABL, nl effort ABD: s/nd/nt, fundus firm and below the umbilicus Lochia: moderate Incision: c/d/i, no significant drainage, no significant erythema Perineum: intact DVT Evaluation: LE non-ttp, no evidence of DVT on  exam.  Hemoglobin  Date Value Ref Range Status  08/28/2021 13.0 12.0 - 15.0 g/dL Final   HCT  Date Value Ref Range Status  08/28/2021 38.4 36.0 - 46.0 % Final     Disposition: stable, discharge to home. Baby Feeding: breastmilk Baby Disposition: home with mom  Rh Immune  globulin given: n/a,  O pos Rubella vaccine given: immune Varicella vaccine given: immune Tdap vaccine given in AP or PP setting: declined Flu vaccine given in AP or PP setting: fall 2022  Contraception: BTL  Prenatal Labs:  Blood type/Rh O pos  Antibody screen neg  Rubella Immune  Varicella Immune  RPR NR  HBsAg Neg  HIV NR  GC neg  Chlamydia neg  Genetic screening negative  1 hour GTT Pre-existing DM  3 hour GTT    GBS pending     Plan:  Adaleah Forget was discharged to home in good condition. Follow-up appointment with delivering provider in 6 weeks.  Discharge Medications: Allergies as of 08/29/2021       Reactions   Sulfa Antibiotics Other (See Comments)        Medication List     STOP taking these medications    aspirin 81 MG EC tablet   insulin lispro 100 UNIT/ML injection Commonly known as: HUMALOG   Lantus SoloStar 100 UNIT/ML Solostar Pen Generic drug: insulin glargine   valACYclovir 500 MG tablet Commonly known as: VALTREX       TAKE these medications    AeroChamber MV inhaler Use as instructed   albuterol 108 (90 Base) MCG/ACT inhaler Commonly known as: VENTOLIN HFA Inhale 2 puffs into the lungs every 4 (four) hours as needed.   cefdinir 300 MG capsule Commonly known as: OMNICEF Take 1 capsule (300 mg total) by mouth 2 (two) times daily for 7 days.   cetirizine 5 MG tablet Commonly known as: ZYRTEC Take 5 mg by mouth daily.   fluticasone 50 MCG/ACT nasal spray Commonly known as: FLONASE Place 1-2 sprays into both nostrils daily as needed for allergies or rhinitis.   ipratropium 0.06 % nasal spray Commonly known as: ATROVENT Place 2 sprays into both nostrils 4 (four) times daily.   omeprazole 20 MG capsule Commonly known as: PRILOSEC Take 20 mg by mouth as needed.   oxyCODONE 5 MG immediate release tablet Commonly known as: Oxy IR/ROXICODONE Take 1-2 tablets (5-10 mg total) by mouth every 4 (four) hours as needed for  moderate pain.   Prenatal 28-0.8 MG Tabs Take 1 tablet by mouth daily.   VITAMIN B 12 PO Take 1,000 mcg by mouth daily at 6 (six) AM.         Follow-up Information     Benjaman Kindler, MD Follow up in 2 week(s).   Specialty: Obstetrics and Gynecology Why: For postop check Contact information: [place] [address] [place], [place], CNM. Schedule an appointment as soon as possible for a visit in 1 week(s).   Specialty: Certified Nurse Midwife Why: for DM and hemorrhoid follow up Contact information: [place] [address] [phone]                 Signed:  Clydene Laming, CNM 08/29/2021 12:19 PM

## 2021-08-28 ENCOUNTER — Inpatient Hospital Stay: Payer: Medicaid Other | Admitting: Anesthesiology

## 2021-08-28 ENCOUNTER — Other Ambulatory Visit: Payer: Self-pay

## 2021-08-28 ENCOUNTER — Encounter
Admission: EM | Disposition: A | Discharge status: Home / Self Care | Payer: Self-pay | Source: Home / Self Care | Attending: Certified Nurse Midwife

## 2021-08-28 HISTORY — PX: TUBAL LIGATION: SHX77

## 2021-08-28 LAB — CBC
HCT: 38.4 % (ref 36.0–46.0)
Hemoglobin: 13 g/dL (ref 12.0–15.0)
MCH: 31.8 pg (ref 26.0–34.0)
MCHC: 33.9 g/dL (ref 30.0–36.0)
MCV: 93.9 fL (ref 80.0–100.0)
Platelets: 197 10*3/uL (ref 150–400)
RBC: 4.09 MIL/uL (ref 3.87–5.11)
RDW: 14.2 % (ref 11.5–15.5)
WBC: 12.4 10*3/uL — ABNORMAL HIGH (ref 4.0–10.5)
nRBC: 0 % (ref 0.0–0.2)

## 2021-08-28 LAB — GLUCOSE, CAPILLARY
Glucose-Capillary: 220 mg/dL — ABNORMAL HIGH (ref 70–99)
Glucose-Capillary: 88 mg/dL (ref 70–99)
Glucose-Capillary: 61 mg/dL — ABNORMAL LOW (ref 70–99)
Glucose-Capillary: 100 mg/dL — ABNORMAL HIGH (ref 70–99)
Glucose-Capillary: 101 mg/dL — ABNORMAL HIGH (ref 70–99)
Glucose-Capillary: 124 mg/dL — ABNORMAL HIGH (ref 70–99)

## 2021-08-28 LAB — HEMOGLOBIN A1C
Hgb A1c MFr Bld: 5.9 % — ABNORMAL HIGH (ref 4.8–5.6)
Mean Plasma Glucose: 122.63 mg/dL

## 2021-08-28 LAB — BASIC METABOLIC PANEL
Anion gap: 4 — ABNORMAL LOW (ref 5–15)
BUN: 12 mg/dL (ref 6–20)
CO2: 22 mmol/L (ref 22–32)
Calcium: 8.9 mg/dL (ref 8.9–10.3)
Chloride: 111 mmol/L (ref 98–111)
Creatinine, Ser: 0.59 mg/dL (ref 0.44–1.00)
GFR, Estimated: >60 mL/min (ref >60)
Glucose, Bld: 108 mg/dL — ABNORMAL HIGH (ref 70–99)
Potassium: 4.2 mmol/L (ref 3.5–5.1)
Sodium: 137 mmol/L (ref 135–145)

## 2021-08-28 LAB — RPR: RPR Ser Ql: NONREACTIVE

## 2021-08-28 SURGERY — LIGATION, FALLOPIAN TUBE, POSTPARTUM
Anesthesia: Spinal | Site: Abdomen

## 2021-08-28 MED ORDER — ACETAMINOPHEN 10 MG/ML IV SOLN: 1000.0000 mg | Freq: Once | INTRAVENOUS | Status: DC | PRN

## 2021-08-28 MED ORDER — OXYCODONE HCL 5 MG PO TABS: 5.0000 mg | ORAL_TABLET | Freq: Once | ORAL | Status: DC | PRN

## 2021-08-28 MED ORDER — METOCLOPRAMIDE HCL 10 MG PO TABS: 10.0000 mg | ORAL_TABLET | Freq: Once | ORAL | Status: DC

## 2021-08-28 MED ORDER — DIPHENHYDRAMINE HCL 50 MG/ML IJ SOLN
INTRAMUSCULAR | Status: DC | PRN
  Administered 2021-08-28: 12.5 mg via INTRAVENOUS

## 2021-08-28 MED ORDER — LACTATED RINGERS IV SOLN: INTRAVENOUS | Status: DC

## 2021-08-28 MED ORDER — PROPOFOL 10 MG/ML IV BOLUS
INTRAVENOUS | Status: AC
  Filled 2021-08-28: qty 20

## 2021-08-28 MED ORDER — INSULIN ASPART 100 UNIT/ML IJ SOLN
0.0000 [IU] | Freq: Three times a day (TID) | INTRAMUSCULAR | Status: DC
  Administered 2021-08-28: 5 [IU] via SUBCUTANEOUS
  Administered 2021-08-29 (×2): 2 [IU] via SUBCUTANEOUS
  Filled 2021-08-28 (×3): qty 1

## 2021-08-28 MED ORDER — FENTANYL CITRATE (PF) 100 MCG/2ML IJ SOLN
INTRAMUSCULAR | Status: DC | PRN
Start: 2021-08-28 — End: 2021-08-28
  Administered 2021-08-28: 35 ug via INTRAVENOUS
  Administered 2021-08-28: 50 ug via INTRAVENOUS

## 2021-08-28 MED ORDER — OXYCODONE HCL 5 MG PO TABS
5.0000 mg | ORAL_TABLET | ORAL | Status: DC | PRN
  Administered 2021-08-28 – 2021-08-29 (×6): 5 mg via ORAL
  Filled 2021-08-28 (×6): qty 1

## 2021-08-28 MED ORDER — FENTANYL CITRATE (PF) 100 MCG/2ML IJ SOLN: 25.0000 ug | INTRAMUSCULAR | Status: DC | PRN

## 2021-08-28 MED ORDER — FENTANYL CITRATE (PF) 100 MCG/2ML IJ SOLN
INTRAMUSCULAR | Status: AC
  Filled 2021-08-28: qty 2

## 2021-08-28 MED ORDER — DROPERIDOL 2.5 MG/ML IJ SOLN: 0.6250 mg | Freq: Once | INTRAMUSCULAR | Status: DC | PRN

## 2021-08-28 MED ORDER — ONDANSETRON HCL 4 MG/2ML IJ SOLN
INTRAMUSCULAR | Status: DC | PRN
Start: 2021-08-28 — End: 2021-08-28
  Administered 2021-08-28: 4 mg via INTRAVENOUS

## 2021-08-28 MED ORDER — INSULIN ASPART 100 UNIT/ML IJ SOLN: 0.0000 [IU] | Freq: Three times a day (TID) | INTRAMUSCULAR | Status: DC

## 2021-08-28 MED ORDER — MIDAZOLAM HCL 2 MG/2ML IJ SOLN
INTRAMUSCULAR | Status: AC
  Filled 2021-08-28: qty 2

## 2021-08-28 MED ORDER — MIDAZOLAM HCL 5 MG/5ML IJ SOLN
INTRAMUSCULAR | Status: DC | PRN
Start: 2021-08-28 — End: 2021-08-28
  Administered 2021-08-28: 2 mg via INTRAVENOUS

## 2021-08-28 MED ORDER — 0.9 % SODIUM CHLORIDE (POUR BTL) OPTIME
TOPICAL | Status: DC | PRN
  Administered 2021-08-28: 1000 mL

## 2021-08-28 MED ORDER — FENTANYL CITRATE (PF) 100 MCG/2ML IJ SOLN
INTRAMUSCULAR | Status: DC | PRN
  Administered 2021-08-28: 15 ug via INTRATHECAL

## 2021-08-28 MED ORDER — PROMETHAZINE HCL 25 MG/ML IJ SOLN: 6.2500 mg | INTRAMUSCULAR | Status: DC | PRN

## 2021-08-28 MED ORDER — PHENYLEPHRINE HCL (PRESSORS) 10 MG/ML IV SOLN
INTRAVENOUS | Status: DC | PRN
  Administered 2021-08-28: 80 ug via INTRAVENOUS

## 2021-08-28 MED ORDER — FAMOTIDINE 20 MG PO TABS: 40.0000 mg | ORAL_TABLET | Freq: Once | ORAL | Status: DC

## 2021-08-28 MED ORDER — GUAIFENESIN-DM 100-10 MG/5ML PO SYRP
5.0000 mL | ORAL_SOLUTION | ORAL | Status: DC | PRN
  Administered 2021-08-28 – 2021-08-29 (×7): 5 mL via ORAL
  Filled 2021-08-28 (×12): qty 5

## 2021-08-28 MED ORDER — OXYCODONE HCL 5 MG/5ML PO SOLN: 5.0000 mg | Freq: Once | ORAL | Status: DC | PRN

## 2021-08-28 MED ORDER — BUPIVACAINE HCL 0.5 % IJ SOLN
INTRAMUSCULAR | Status: DC | PRN
  Administered 2021-08-28: 10 mL

## 2021-08-28 MED ORDER — SODIUM CHLORIDE 0.9 % IV SOLN: INTRAVENOUS | Status: DC | PRN

## 2021-08-28 MED ORDER — BUPIVACAINE IN DEXTROSE 0.75-8.25 % IT SOLN
INTRATHECAL | Status: DC | PRN
  Administered 2021-08-28: 1.6 mL via INTRATHECAL

## 2021-08-28 SURGICAL SUPPLY — 39 items
BACTOSHIELD CHG 4% 4OZ (MISCELLANEOUS) ×1
BLADE SURG SZ11 CARB STEEL (BLADE) ×2 IMPLANT
BNDG ADH 1X3 SHEER STRL LF (GAUZE/BANDAGES/DRESSINGS) ×1 IMPLANT
CHLORAPREP W/TINT 26 (MISCELLANEOUS) ×2 IMPLANT
DERMABOND ADVANCED (GAUZE/BANDAGES/DRESSINGS) ×1
DERMABOND ADVANCED .7 DNX12 (GAUZE/BANDAGES/DRESSINGS) ×1 IMPLANT
DRAPE LAPAROTOMY 100X77 ABD (DRAPES) ×2 IMPLANT
DRSG TEGADERM 4X4.75 (GAUZE/BANDAGES/DRESSINGS) ×1 IMPLANT
ELECT REM PT RETURN 9FT ADLT (ELECTROSURGICAL) ×2
ELECTRODE REM PT RTRN 9FT ADLT (ELECTROSURGICAL) ×1 IMPLANT
GAUZE 4X4 16PLY ~~LOC~~+RFID DBL (SPONGE) ×2 IMPLANT
GAUZE SPONGE 4X4 12PLY STRL (GAUZE/BANDAGES/DRESSINGS) ×1 IMPLANT
GLOVE BIO SURGEON STRL SZ7 (GLOVE) ×2 IMPLANT
GLOVE SURG UNDER LTX SZ7.5 (GLOVE) ×2 IMPLANT
GOWN STRL REUS W/ TWL LRG LVL3 (GOWN DISPOSABLE) ×1 IMPLANT
GOWN STRL REUS W/TWL LRG LVL3 (GOWN DISPOSABLE) ×1
KIT TURNOVER CYSTO (KITS) ×2 IMPLANT
LABEL OR SOLS (LABEL) ×2 IMPLANT
LIGASURE IMPACT 36 18CM CVD LR (INSTRUMENTS) ×1 IMPLANT
MANIFOLD NEPTUNE II (INSTRUMENTS) ×2 IMPLANT
NEEDLE HYPO 22GX1.5 SAFETY (NEEDLE) ×2 IMPLANT
NS IRRIG 1000ML POUR BTL (IV SOLUTION) ×1 IMPLANT
PACK BASIN MINOR ARMC (MISCELLANEOUS) ×2 IMPLANT
PAD OB MATERNITY 4.3X12.25 (PERSONAL CARE ITEMS) ×2 IMPLANT
RETRACTOR RING XSMALL (MISCELLANEOUS) IMPLANT
RETRACTOR WOUND ALXS 18CM SML (MISCELLANEOUS) ×1 IMPLANT
RTRCTR WOUND ALEXIS 13CM XS SH (MISCELLANEOUS) ×2
RTRCTR WOUND ALEXIS O 18CM SML (MISCELLANEOUS)
SCRUB CHG 4% DYNA-HEX 4OZ (MISCELLANEOUS) ×1 IMPLANT
SPONGE T-LAP 4X18 ~~LOC~~+RFID (SPONGE) ×2 IMPLANT
SUT CHROMIC GUT BROWN 0 54 (SUTURE) ×1 IMPLANT
SUT CHROMIC GUT BROWN 0 54IN (SUTURE) ×2
SUT MNCRL 4-0 (SUTURE) ×1
SUT MNCRL 4-0 27XMFL (SUTURE) ×1
SUT VIC AB 0 CT2 27 (SUTURE) ×2 IMPLANT
SUT VICRYL 0 AB UR-6 (SUTURE) ×4 IMPLANT
SUTURE MNCRL 4-0 27XMF (SUTURE) ×1 IMPLANT
SYR 10ML LL (SYRINGE) ×2 IMPLANT
WATER STERILE IRR 500ML POUR (IV SOLUTION) ×2 IMPLANT

## 2021-08-28 NOTE — Progress Notes (Signed)
Bladder scan @ 1315 for 123 ml urine. ?

## 2021-08-28 NOTE — Progress Notes (Signed)
Inpatient Diabetes Program Recommendations ? ?AACE/ADA: New Consensus Statement on Inpatient Glycemic Control (2015) ? ?Target Ranges:  Prepandial:   less than 140 mg/dL ?     Peak postprandial:   less than 180 mg/dL (1-2 hours) ?     Critically ill patients:  140 - 180 mg/dL  ? ?Lab Results  ?Component Value Date  ? GLUCAP 100 (H) 08/28/2021  ? HGBA1C 5.9 (H) 08/27/2021  ? ? ?Review of Glycemic Control ? ?Diabetes history: DM2 ?Outpatient Diabetes medications: None ?Current orders for Inpatient glycemic control: Novolog 0-15 TID ? ?HgbA1C - 5.9% ? ?Inpatient Diabetes Program Recommendations:   ? ?F/U with PCP for diabetes management ? ?May consider metformin 500 mg BID at discharge ? ?Stress lifestyle modification with weight loss, healthy diet with portion control, exercise and stress management. ? ?Monitor blood sugars prn. ? ?Thank you. ?Lorenda Peck, RD, LDN, CDE ?Inpatient Diabetes Coordinator ?[phone]  ? ? ? ? ?

## 2021-08-28 NOTE — Lactation Note (Signed)
This note was copied from a baby's chart. ?Lactation Consultation Note ? ?Patient Name: Alexa Rosales ?Today's Date: 08/28/2021 ?Reason for consult: Initial assessment;NICU baby;Late-preterm 34-36.6wks ?Age:37 hours ? ?Brief lactation check-in; mom and oldest daughter were on the way to SCN to see baby. ? ?Maternal Data ?Has patient been taught Hand Expression?: Yes ? ?Feeding ?Mother's Current Feeding Choice: Breast Milk ? ?Mom desires breastfeeding; baby is currently NPO.  ? ?LATCH Score ?  ? ? ?Lactation Tools Discussed/Used ?Tools: Pump ?Breast pump type: Double-Electric Breast Pump ?Pump Education:  (pump set-up overnight) ?Reason for Pumping: baby in SCN; mom desires to BF ?Pumping frequency:  (mom has carpal tunnel; difficulty holding the pieces) ? ?Mom was set-up with pumping overnight however mom has not been able to pump for recommended 15 minutes due to pain in hand from carpel tunnel. LC to assist with next pumping session and work on hands free option with use of nursing bra. ? ?Interventions ?Interventions: DEBP;Education;Hand express;Breast massage;Breast compression ? ?Discharge ?  ? ?Consult Status ?Consult Status: Follow-up ? ? ? ?Lavonia Drafts ?08/28/2021, 9:19 AM ? ? ? ?

## 2021-08-28 NOTE — Anesthesia Postprocedure Evaluation (Signed)
Anesthesia Post Note ? ?Patient: Alexa Rosales ? ?Procedure(s) Performed: POST PARTUM TUBAL LIGATION (Abdomen) ? ?Patient location during evaluation: PACU ?Anesthesia Type: Spinal ?Level of consciousness: awake and alert ?Pain management: pain level controlled ?Vital Signs Assessment: post-procedure vital signs reviewed and stable ?Respiratory status: spontaneous breathing, nonlabored ventilation and respiratory function stable ?Cardiovascular status: blood pressure returned to baseline and stable ?Postop Assessment: no apparent nausea or vomiting, spinal receding and patient able to bend at knees ?Anesthetic complications: no ? ? ?No notable events documented. ? ? ?Last Vitals:  ?Vitals:  ? 08/28/21 1337 08/28/21 1358  ?BP: 115/75 (!) 126/91  ?Pulse: 82 86  ?Resp: 14 18  ?Temp: (!) 36.4 ?C 36.6 ?C  ?SpO2: 100% 100%  ?  ?Last Pain:  ?Vitals:  ? 08/28/21 1358  ?TempSrc: Oral  ?PainSc:   ? ? ?  ?  ?  ?  ?  ?  ? ?[place] Ouch ? ? ? ? ?

## 2021-08-28 NOTE — Progress Notes (Signed)
Patient desires permanent sterilization.  Other reversible forms of contraception were discussed with patient; she declines all other modalities. Risks of procedure discussed with patient including but not limited to: risk of regret, permanence of method, bleeding, infection, injury to surrounding organs and need for additional procedures.  Failure risk of 1-2 % with increased risk of ectopic gestation if pregnancy occurs was also discussed with patient.  Patient verbalized understanding of these risks and wants to proceed with sterilization.  Written informed consent obtained.  To OR when ready. ? ? ?MA31 papers signed 07/02/21 ?

## 2021-08-28 NOTE — Progress Notes (Signed)
Patient back to room after bilateral tubal ligation.  CBG 61, 118 ml apple juice given to patient and will recheck CBG in 15 minutes.  Patient states that prior to pregnancy she did not take anything for hyperglycemia.  Diabetes Coordinator contacted and updated. ?

## 2021-08-28 NOTE — Progress Notes (Signed)
Post Partum Day 1 ? ?Subjective: ?Doing well, no concerns. Ambulating without difficulty, pain managed with PO meds, tolerating regular diet, and voiding without difficulty.  ? ?No fever/chills, chest pain, shortness of breath, nausea/vomiting, or leg pain. No nipple or breast pain.  ? ?Objective: ?BP 122/75 (BP Location: Right Arm)   Pulse 83   Temp 98.4 ?F (36.9 ?C) (Oral)   Resp 20   Ht '5\' 6"'$  (1.676 m)   Wt 103.4 kg   LMP 12/07/2020   SpO2 97%   Breastfeeding Unknown   BMI 36.80 kg/m?  ?  ?Physical Exam:  ?General: alert and cooperative ?Breasts: soft/nontender ?CV: RRR ?Pulm: nl effort ?Abdomen: soft, non-tender ?Uterine Fundus: firm ?Incision: n/a ?Perineum: repair well approximated ?Lochia: appropriate ?DVT Evaluation: No evidence of DVT seen on physical exam. ?Edinburgh:  ?   ? View : No data to display.  ?  ?  ?  ?  ? ?Recent Labs  ?  08/27/21 ?2201 08/28/21 ?0602  ?HGB 15.2* 13.0  ?HCT 44.1 38.4  ?WBC 11.1* 12.4*  ?PLT 239 197  ? ? ?Assessment/Plan: ?36 y.o. [id number] postpartum day # 2 ? ?-Continue routine postpartum care ?-Lactation consult PRN for breastfeeding   ?-Acute blood loss anemia - hemodynamically stable and asymptomatic; start PO ferrous sulfate BID with stool softeners  ?-Immunization status: all immunizations up to date ? ?Disposition: Continue inpatient postpartum care  ? ? LOS: 1 day  ? ?Alexa Rosales, CNM ?08/28/2021, 9:23 AM  ?  ?

## 2021-08-28 NOTE — Anesthesia Procedure Notes (Signed)
Spinal ? ?Patient location during procedure: OR ?Start time: 08/28/2021 11:32 AM ?End time: 08/28/2021 11:37 AM ?Reason for block: surgical anesthesia ?Staffing ?Performed: resident/CRNA  ?Anesthesiologist: Iran Ouch, MD ?Resident/CRNA: Jerrye Noble, CRNA ?Preanesthetic Checklist ?Completed: patient identified, IV checked, site marked, risks and benefits discussed, surgical consent, monitors and equipment checked, pre-op evaluation and timeout performed ?Spinal Block ?Patient position: sitting ?Prep: ChloraPrep ?Patient monitoring: heart rate, continuous pulse ox, blood pressure and cardiac monitor ?Approach: midline ?Location: L3-4 ?Injection technique: single-shot ?Needle ?Needle type: Introducer and Pencan  ?Needle gauge: 24 G ?Needle length: 9 cm ?Assessment ?Events: CSF return ?Additional Notes ?Sterile aseptic technique used throughout the procedure.  Negative paresthesia. Negative blood return. Positive free-flowing CSF. Expiration date of kit checked and confirmed. Patient tolerated procedure well, without complications. ? ? ? ? ? ?

## 2021-08-28 NOTE — Anesthesia Preprocedure Evaluation (Addendum)
Anesthesia Evaluation  ?Patient identified by MRN, date of birth, ID band ?Patient awake ? ? ? ?Reviewed: ?Allergy & Precautions, NPO status , Patient's Chart, lab work & pertinent test results ? ?Airway ?Mallampati: III ? ?TM Distance: >3 FB ?Neck ROM: full ? ? ? Dental ? ?(+) Missing ?  ?Pulmonary ?neg pulmonary ROS,  ?Bronchitis this past month ?  ?Pulmonary exam normal ? ? ? ? ? ? ? Cardiovascular ?Exercise Tolerance: Good ?negative cardio ROS ?Normal cardiovascular exam ? ? ?  ?Neuro/Psych ?negative neurological ROS ? negative psych ROS  ? GI/Hepatic ?negative GI ROS, Neg liver ROS,   ?Endo/Other  ?diabetes, Well Controlled, Type 2, Insulin Dependent ? Renal/GU ?negative Renal ROS  ?negative genitourinary ?  ?Musculoskeletal ? ? Abdominal ?(+) + obese,   ?Peds ? Hematology ?negative hematology ROS ?(+)   ?Anesthesia Other Findings ?Past Medical History: ?12/29/2015: Abnormal uterine bleeding (AUB) ?No date: Diabetes mellitus without complication ([place]) ?No date: History of anemia ?11/13/2012: Menorrhagia ? ?Past Surgical History: ?No date: CESAREAN SECTION ?No date: HERNIA REPAIR ?No date: WISDOM TOOTH EXTRACTION ? ?BMI   ? Body Mass Index: 36.80 kg/m?  ?  ? ? Reproductive/Obstetrics ?(+) Pregnancy ? ?  ? ? ? ? ? ? ? ? ? ? ? ? ? ?  ?  ? ? ? ? ? ? ? ?Anesthesia Physical ?Anesthesia Plan ? ?ASA: 2 ? ?Anesthesia Plan: Spinal  ? ?Post-op Pain Management: Tylenol PO (pre-op)* and Toradol IV (intra-op)*  ? ?Induction: Intravenous ? ?PONV Risk Score and Plan: 2 and Treatment may vary due to age or medical condition and Ondansetron ? ?Airway Management Planned: Natural Airway ? ?Additional Equipment:  ? ?Intra-op Plan:  ? ?Post-operative Plan:  ? ?Informed Consent: I have reviewed the patients History and Physical, chart, labs and discussed the procedure including the risks, benefits and alternatives for the proposed anesthesia with the patient or authorized representative who has  indicated his/her understanding and acceptance.  ? ? ? ?Dental Advisory Given ? ?Plan Discussed with: Anesthesiologist ? ?Anesthesia Plan Comments:   ? ? ? ? ? ? ?Anesthesia Quick Evaluation ? ?

## 2021-08-28 NOTE — Progress Notes (Signed)
Patient to OR via bed.

## 2021-08-28 NOTE — Discharge Instructions (Signed)
Tubal Ligation Discharge Instructions ? ?Tubal removal obstructs/removes your fallopian tubes to prevent pregnancy in the future. ? ? ?For the next three days, take ibuprofen and acetaminophen on a schedule, every 8 hours. You can take them together or you can intersperse them, and take one every four hours. I also gave you gabapentin for nighttime, to help you sleep and also to control pain. Take gabapentin medicines at night for at least the next 3 nights. You also have a narcotic, oxycodone, to take as needed if the above medicines don't help. ? ?Postop constipation is a major cause of pain. Stay well hydrated, walk as you tolerate, and take over the counter senna as well as stool softeners if you need them. ? ?RISKS AND COMPLICATIONS  ?Infection. ?Bleeding. ?Injury to surrounding organs. ?Anesthetic side effects. ?Failure of the procedure. ?Risks of future ectopic pregnancy ? ?PROCEDURE  ?You may be given a medicine to help you relax (sedative) before the procedure. You will be given a medicine to make you sleep (general anesthetic) during the procedure. ?A tube will be put down your throat to help your breath while under general anesthesia. ?Two small cuts (incisions) are made in the lower abdominal area and one incision is made near the belly button. ?Your abdominal area will be inflated with a safe gas (carbon dioxide). This helps give the surgeon room to operate, visualize, and helps the surgeon avoid other organs. ?A thin, lighted tube (laparoscope) with a camera attached is inserted into your abdomen through the incision near the belly button. Other small instruments may also be inserted through other abdominal incisions. ?The fallopian tube is located and are removed. ?After the fallopian tube is removed, the gas is released from the abdomen. ?The incisions will be closed with stitches (sutures), and Dermabond. A bandage may be placed over the incisions. ? ?AFTER THE PROCEDURE  ?You will also have some  mild abdominal discomfort for 3-7 days. You will be given pain medicine to ease any discomfort. ?As long as there are no problems, you may be allowed to go home. Someone will need to drive you home and be with you for at least 24 hours once home. ?You may have some mild discomfort in the throat. This is from the tube placed in your throat while you were sleeping. ?You may experience discomfort in the shoulder area from some trapped air between the liver and diaphragm. This sensation is normal and will slowly go away on its own. ? ?[place]ouris  ?Take all medicines as directed. ?Only take over-the-counter or prescription medicines for pain, discomfort, or fever as directed by your caregiver. ?Resume daily activities as directed. ?Showers are preferred over baths. ?You may resume sexual activities in 1 week or as directed. ?Do not drive while taking narcotics. ? ?SEEK MEDICAL CARE IF: . ?There is increasing abdominal pain. ?You feel lightheaded or faint. ?You have the chills. ?You have an oral temperature above 102? F (38.9? C). ?There is pus-like (purulent) drainage from any of the wounds. ?You are unable to pass gas or have a bowel movement. ?You feel sick to your stomach (nauseous) or throw up (vomit). ? ?MAKE SURE YOU:  ?Understand these instructions. ?Will watch your condition. ?Will get help right away if you are not doing well or get worse. ? ?ExitCare? Patient Information ?[address] [place]. ? ? ? Here is a helpful article from the website [employer], regarding constipation ? ?Here are reasons why constipation occurs after surgery: ?1) During the operation  and in the recovery room, most people are given opioid pain medication, primarily through an IV, to treat moderate or severe pain. Intravenous opioids include morphine, Dilaudid and fentanyl. After surgery, patients are often prescribed opioid pain medication to take by mouth at home, including codeine, Vicodin?, Norco?, and Percocet?. All of  these medications cause constipation by slowing down the movement of your intestine. ?2) Changes in your diet before surgery can be another culprit. It is common to get specific instructions to change how you normally eat or drink before your surgery, like only having liquids the day before or not having anything to eat or drink after midnight the night before surgery. For this reason, temporary dehydration may occur. This, along with not eating or only having liquids, means that you are getting less fiber than usual. Both these factors contribute to constipation. ?3) Changes in your diet after surgery can also contribute to the problem. Although many people don?t have dietary restrictions after operations, being under anesthesia can make you lose your appetite for several hours and maybe even days. Some people can even have nausea or vomiting. Not eating or drinking normally means that you are not getting enough fiber and you can get dehydrated, both leading to constipation. ?4) Lying in a bed more than usual--which happens before, during and after surgery--combined with the medications and diet changes, all work together to slow down your colon and make your poop turn to rock. ? ?No one likes to be constipated.  ?Let?s face it, it?s not a pleasant feeling when you don?t poop for days, then strain on the toilet to finally pass something large enough to cause damage. An ounce of prevention is worth a pound of cure, so: ?Assume you will be constipated. ?Plan and prepare accordingly. ?Post-surgery is one of those unique situations where the temporary use of laxatives can make a world of difference. Always consult with your doctor, and recognize that if you wait several days after surgery to take a laxative, the constipation might be too severe for these over-the-counter options. It is always important to discuss all medications you plan on taking with your doctor. Ask your doctor if you can start the laxative  immediately after surgery. * ? ?Here are go-to post-surgery laxatives: ?Senna: Senna is an herb that acts as a ?stimulant laxative,? meaning it increases the activity of the intestine to cause you to have a bowel movement. It comes in many forms, but senna pills are easy to take and are sold over the counter at almost all pharmacies. Since opioid pain medications slow down the activity of the intestine, it makes sense to take a medication to help reverse that side effect. Long-term use of a stimulant laxative is not a good idea since it can make your colon ?lazy? and not function properly; however, temporary use immediately after surgery is acceptable. In general, if you are able to eat a normal diet, taking senna soon after surgery works the best. Senna usually works within hours to produce a bowel movement, but this is less predictable when you are taking different medications after surgery. Try not to wait several days to start taking senna, as often it is too late by then. Just like with all medications or supplements, check with your doctor before starting new treatment.  ? ?Magnesium: Magnesium is an important mineral that our body needs. We get magnesium from some foods that we eat, especially foods that are high in fiber such as broccoli, almonds and whole  grains. There are also magnesium-based medications used to treat constipation including milk of magnesia (magnesium hydroxide), magnesium citrate and magnesium oxide. They work by drawing water into the intestine, putting it into the class of ?osmotic? laxatives. Magnesium products in low doses appear to be safe, but if taken in very large doses, can lead to problems such as irregular heartbeat, low blood pressure and even death. It can also affect other medications you might be taking, therefore it is important to discuss using magnesium with your physician and pharmacist before initiating therapy. Most over-the-counter magnesium laxatives work very well  to help with the constipation related to surgery, but sometimes they work too well and lead to diarrhea. Make sure you are somewhere with easy access to a bathroom, just in case.  ? ?Bisacodyl: Bisacodyl (generic

## 2021-08-28 NOTE — Transfer of Care (Signed)
Immediate Anesthesia Transfer of Care Note ? ?Patient: Alexa Rosales ? ?Procedure(s) Performed: POST PARTUM TUBAL LIGATION (Abdomen) ? ?Patient Location: PACU ? ?Anesthesia Type:General ? ?Level of Consciousness: drowsy and patient cooperative ? ?Airway & Oxygen Therapy: Patient Spontanous Breathing ? ?Post-op Assessment: Report given to RN and Post -op Vital signs reviewed and stable ? ?Post vital signs: Reviewed and stable ? ?Last Vitals:  ?Vitals Value Taken Time  ?BP 123/77 08/28/21 1232  ?Temp 36.1 ?C 08/28/21 1232  ?Pulse 86 08/28/21 1234  ?Resp 13 08/28/21 1234  ?SpO2 97 % 08/28/21 1234  ?Vitals shown include unvalidated device data. ? ?Last Pain:  ?Vitals:  ? 08/28/21 1232  ?TempSrc:   ?PainSc: Asleep  ?   ? ?  ? ?Complications: No notable events documented. ?

## 2021-08-28 NOTE — Op Note (Signed)
Mena Pauls ?08/28/2021 ? ?PREOPERATIVE DIAGNOSES: Multiparity, undesired fertility ? ?POSTOPERATIVE DIAGNOSES: Multiparity, undesired fertility ? ?PROCEDURE:  Postpartum Bilateral Tubal Sterilization by bilateral partial salpingectomy, including fimbriae ? ?SURGEON: Dr. Benjaman Kindler ? ?ANESTHESIA:  Spinal + 10 ml of 0.5% Marcaine ? ?Anesthesiologist: Iran Ouch, MD Anesthesiologist: Iran Ouch, MD ?CRNA: Jerrye Noble, CRNA ? ?COMPLICATIONS:  None immediate. ? ?ESTIMATED BLOOD LOSS: minimal. ? ?FLUIDS: 700 ml LR. ? ?URINE OUTPUT:  Not assessed ? ?INDICATIONS:  36 y.o. [id number] with undesired fertility,status post vaginal delivery, desires permanent sterilization.  Other reversible forms of contraception were discussed with patient; she declines all other modalities. Risks of procedure discussed with patient including but not limited to: risk of regret, permanence of method, bleeding, infection, injury to surrounding organs and need for additional procedures.  Failure risk of 1 -2 % with increased risk of ectopic gestation if pregnancy occurs was also discussed with patient.   ?   ?FINDINGS:  Normal uterus, tubes, and ovaries. ? ?PROCEDURE DETAILS:  ?The patient was taken to the operating room where her epidural anesthesia was dosed up to surgical level and found to be adequate.  She was then placed in the dorsal supine position and prepped and draped in sterile fashion.  After an adequate timeout was performed, attention was turned to the patient's abdomen where a small transverse skin incision was made under the umbilical fold. The incision was taken down to the layer of fascia using the scalpel, and fascia was incised, and extended bilaterally using Mayo scissors. The peritoneum was entered in a sharp fashion. Attention was then turned to the patient's uterus, and left fallopian tube was identified and followed out to the fimbriated end. Using a Ligasure bipolar cautery device, a  portion of the left tube including the fimbriated end was cauterized and sharply excised.  A similar process was carried out on the right side allowing for bilateral tubal sterilization.  Good hemostasis was noted overall.  Local analgesia was poured over both adenexa.The instruments were then removed from the patient's abdomen and the fascial incision was repaired with 0 Vicryl, and the skin was closed with a 4-0 monocryl subcuticular stitch. The patient tolerated the procedure well.  Instrument, sponge, and needle counts were correct times two.  The patient was then taken to the recovery room awake and in stable condition. ?   ? ?  ?

## 2021-08-29 ENCOUNTER — Encounter: Payer: Self-pay | Admitting: Obstetrics and Gynecology

## 2021-08-29 LAB — BASIC METABOLIC PANEL
Anion gap: 9 (ref 5–15)
BUN: 14 mg/dL (ref 6–20)
CO2: 24 mmol/L (ref 22–32)
Calcium: 8.4 mg/dL — ABNORMAL LOW (ref 8.9–10.3)
Chloride: 105 mmol/L (ref 98–111)
Creatinine, Ser: 0.86 mg/dL (ref 0.44–1.00)
GFR, Estimated: >60 mL/min (ref >60)
Glucose, Bld: 139 mg/dL — ABNORMAL HIGH (ref 70–99)
Potassium: 4 mmol/L (ref 3.5–5.1)
Sodium: 138 mmol/L (ref 135–145)

## 2021-08-29 LAB — GLUCOSE, CAPILLARY
Glucose-Capillary: 121 mg/dL — ABNORMAL HIGH (ref 70–99)
Glucose-Capillary: 142 mg/dL — ABNORMAL HIGH (ref 70–99)
Glucose-Capillary: 84 mg/dL (ref 70–99)

## 2021-08-29 MED ORDER — OXYCODONE HCL 5 MG PO TABS
5.0000 mg | ORAL_TABLET | ORAL | 0 refills | Status: DC | PRN
Start: 2021-08-29 — End: 2021-08-29

## 2021-08-29 MED ORDER — HYDROCORTISONE 1 % EX CREA
TOPICAL_CREAM | Freq: Four times a day (QID) | CUTANEOUS | Status: DC
  Filled 2021-08-29: qty 28

## 2021-08-29 MED ORDER — OXYCODONE HCL 5 MG PO TABS
5.0000 mg | ORAL_TABLET | ORAL | 0 refills | Status: DC | PRN
Start: 2021-08-29 — End: 2022-03-04

## 2021-08-29 NOTE — Progress Notes (Signed)
Pt. Back to room form visiting Infant in SCN. Normal Gait. Alert and oriented with cheerful affect. D/C Instructions and F/U appointment instructions read to Pt. And she v/o. D/C to home. ?

## 2021-08-30 LAB — GLUCOSE, CAPILLARY: Glucose-Capillary: 113 mg/dL — ABNORMAL HIGH (ref 70–99)

## 2021-08-31 LAB — SURGICAL PATHOLOGY

## 2021-09-18 ENCOUNTER — Encounter: Payer: Self-pay | Admitting: Obstetrics and Gynecology

## 2021-09-30 ENCOUNTER — Telehealth: Payer: Self-pay

## 2021-09-30 NOTE — Telephone Encounter (Signed)
Lactation Note:  Returned patient's phone call at her request. She left a message this morning on the Lactation office phone line that her breasts were so painful that she didn't know if she could continue breastfeeding and if she could receive a phone call as soon as possible.  Per patient's report her baby girl is 67 month old. She is an experienced breastfeeding mother. She has been breastfeeding her baby and also pumping and providing baby with bottles of expressed breastmilk. Patient reports she began to experience itchy nipples and discomfort about a week ago so she she started applying nipple balm to her nipples. Currently she states, "I am having shooting pains in to my breast" and " I had to hold my breast it hurt so bad." In discussion she reports her nipples are red. Also of note, "baby has a red diaper rash from her labia to the top of her butt crack." Per patient she and baby were both treated with antibiotics when she was in the hospital for the birth of her baby. Also, [facility] and had assistance from a lactation consultant to check for proper latch. Baby was latching correctly. Per patient she is concerned if latch is correct why is she in so much nipple and breast pain.   Discussed with patient potential for nipple/breast thrush. Recommended mom call her OB doctor and call baby's Pediatrician. Proactively reviewed management plan if thrush is confirmed including: wash/boil breast pump parts and pieces, bottle nipples, pacifier once a day for 20 minutes, care of bathing towels, decrease sugar and dairy consumption, frequent breast pad changes, complete treatment prescribed even if she starts to feel better and symptoms have dissipated, good hand washing after changing baby's diaper, use of acidophilus bifididus (1 tablet per day until 2 weeks after treatment is complete) Also, recommended mom offer baby the least sore side first for several feeds in a row and  offer shorter more frequent feedings.  Patient verbalized understanding of information provided. Her plan is to contact her OB and baby's Pediatrician.  Jerilynn Som RN, BSN, IBCLC

## 2021-10-19 ENCOUNTER — Ambulatory Visit
Admission: EM | Admit: 2021-10-19 | Discharge: 2021-10-19 | Disposition: A | Discharge status: Home / Self Care | Payer: Medicaid Other | Attending: Internal Medicine | Admitting: Internal Medicine

## 2021-10-19 ENCOUNTER — Encounter: Payer: Self-pay | Admitting: Emergency Medicine

## 2021-10-19 DIAGNOSIS — J019 Acute sinusitis, unspecified: Secondary | ICD-10-CM | POA: Diagnosis not present

## 2021-10-19 MED ORDER — AMOXICILLIN-POT CLAVULANATE 875-125 MG PO TABS: 1.0000 | ORAL_TABLET | Freq: Two times a day (BID) | ORAL | 0 refills | Status: AC

## 2021-10-19 NOTE — ED Triage Notes (Signed)
Pt presents with nasal congestion, sore throat  and productive cough x 2 weeks. States have been coughing up "green mucous", States have tried Flonase, sudafed and Mucinex with some relief.

## 2021-10-19 NOTE — ED Provider Notes (Signed)
[facility]    CSN: [id number] Arrival date & time: 10/19/21  1307      History   Chief Complaint Chief Complaint  Patient presents with   Cough   Sore Throat   Nasal Congestion    HPI Alexa Rosales is a 36 y.o. female.  She presents today with a couple weeks history of runny/congested nose, now with headache and productive cough.  Some sore throat.  Not vomiting, no diarrhea.  No fever.  Has a history of sinus cysts, work-up for this was interrupted by pregnancy and recent delivery.  Has been using Flonase and Sudafed to manage symptoms.   Cough Sore Throat    Past Medical History:  Diagnosis Date   Abnormal uterine bleeding (AUB) 12/29/2015   Diabetes mellitus without complication (HCC)    History of anemia    Menorrhagia 11/13/2012    Patient Active Problem List   Diagnosis Date Noted   Diabetes in pregnancy 08/27/2021   Uterine contractions during pregnancy 08/25/2021   Gestational hypertension without significant proteinuria 08/25/2021   Supervision of high risk pregnancy in third trimester 08/22/2021   AMA (advanced maternal age) multigravida 35+, third trimester 08/22/2021   Preexisting diabetes complicating pregnancy, antepartum 05/06/2021   Obesity in pregnancy, antepartum 05/06/2021   Chronic rhinitis 07/23/2018   Family history of breast cancer in female 11/15/2011   Previous cesarean delivery affecting pregnancy, antepartum 11/15/2011   Allergic rhinitis 08/25/2011   Genital herpes 08/25/2011    Past Surgical History:  Procedure Laterality Date   CESAREAN SECTION     HERNIA REPAIR     TUBAL LIGATION N/A 08/28/2021   Procedure: POST PARTUM TUBAL LIGATION;  Surgeon: Benjaman Kindler, MD;  Location: [facility] ORS;  Service: Gynecology;  Laterality: N/A;   WISDOM TOOTH EXTRACTION      OB History     Gravida  7   Para  6   Term  4   Preterm  2   AB  1   Living  6      SAB  1   IAB      Ectopic      Multiple  0   Live  Births  6        Obstetric Comments  Due Date September 20, 2021          Home Medications    Prior to Admission medications   Medication Sig Start Date End Date Taking? Authorizing Provider  amoxicillin-clavulanate (AUGMENTIN) 875-125 MG tablet Take 1 tablet by mouth 2 (two) times daily for 10 days. 10/19/21 10/29/21 Yes Wynona Luna, MD  albuterol (VENTOLIN HFA) 108 (90 Base) MCG/ACT inhaler Inhale 2 puffs into the lungs every 4 (four) hours as needed. 08/22/21   Margarette Canada, NP  cetirizine (ZYRTEC) 5 MG tablet Take 5 mg by mouth daily.    [provider]  Cyanocobalamin (VITAMIN B 12 PO) Take 1,000 mcg by mouth daily at 6 (six) AM.    [provider]  fluticasone (FLONASE) 50 MCG/ACT nasal spray Place 1-2 sprays into both nostrils daily as needed for allergies or rhinitis.    [provider]  ipratropium (ATROVENT) 0.06 % nasal spray Place 2 sprays into both nostrils 4 (four) times daily. 08/01/21   Danton Clap, PA-C  omeprazole (PRILOSEC) 20 MG capsule Take 20 mg by mouth as needed.    [provider]  oxyCODONE (OXY IR/ROXICODONE) 5 MG immediate release tablet Take 1-2 tablets (5-10 mg total) by  mouth every 4 (four) hours as needed for moderate pain. 08/29/21   Linda Hedges, CNM  Prenatal 28-0.8 MG TABS Take 1 tablet by mouth daily.    [provider]  Spacer/Aero-Holding Chambers (AEROCHAMBER MV) inhaler Use as instructed 08/22/21   Margarette Canada, NP  ferrous sulfate 325 (65 FE) MG tablet Take 325 mg by mouth daily with breakfast.  02/15/20  [provider]    Family History Family History  Problem Relation Age of Onset   Diabetes Mother    Hypertension Mother    Sarcoidosis Father    Asthma Sister    Breast cancer Maternal Aunt        great MAT   Diabetes Maternal Grandmother    Colon cancer Maternal Grandmother    Breast cancer Paternal Grandmother     Social History Social History   Tobacco Use   Smoking  status: Never   Smokeless tobacco: Never  Vaping Use   Vaping Use: Never used  Substance Use Topics   Alcohol use: Not Currently    Comment: occassional   Drug use: No     Allergies   Sulfa antibiotics   Review of Systems Review of Systems  Respiratory:  Positive for cough.      Physical Exam Triage Vital Signs ED Triage Vitals  Enc Vitals Group     BP 10/19/21 1321 118/81     Pulse Rate 10/19/21 1321 80     Resp 10/19/21 1321 19     Temp 10/19/21 1321 98.7 F (37.1 C)     Temp Source 10/19/21 1321 Oral     SpO2 10/19/21 1321 99 %     Weight --      Height --      Head Circumference --      Peak Flow --      Pain Score 10/19/21 1319 0     Pain Loc --      Pain Edu? --      Excl. in [place]? --    No data found.  Updated Vital Signs BP 118/81 (BP Location: Left Arm)   Pulse 80   Temp 98.7 F (37.1 C) (Oral)   Resp 19   SpO2 99%   Breastfeeding Yes   Visual Acuity Right Eye Distance:   Left Eye Distance:   Bilateral Distance:    Right Eye Near:   Left Eye Near:    Bilateral Near:     Physical Exam Constitutional:      General: She is not in acute distress.    Appearance: She is not ill-appearing.     Comments: Good hygiene  HENT:     Head: Atraumatic.     Comments: Bilateral TMs are moderately dull, no erythema Moderate to severe nasal congestion bilaterally Posterior pharynx erythematous    Mouth/Throat:     Mouth: Mucous membranes are moist.  Eyes:     Conjunctiva/sclera:     Right eye: Right conjunctiva is not injected. No exudate.    Left eye: Left conjunctiva is not injected. No exudate.    Comments: Conjugate gaze observed  Cardiovascular:     Rate and Rhythm: Normal rate and regular rhythm.  Pulmonary:     Effort: Pulmonary effort is normal. No respiratory distress.     Breath sounds: No wheezing or rhonchi.  Abdominal:     General: There is no distension.  Musculoskeletal:     Cervical back: Neck supple.     Comments: Walked into  the urgent care independently  Skin:    General: Skin is warm and dry.     Comments: No cyanosis  Neurological:     Mental Status: She is alert.     Comments: Face symmetric, speech clear, coherent, logical      UC Treatments / Results  Labs (all labs ordered are listed, but only abnormal results are displayed) Labs Reviewed - No data to display  EKG   Radiology No results found.  Procedures Procedures (including critical care time)  Medications Ordered in UC Medications - No data to display  Initial Impression / Assessment and Plan / UC Course  I have reviewed the triage vital signs and the nursing notes.  Pertinent labs & imaging results that were available during my care of the patient were reviewed by me and considered in my medical decision making (see chart for details).     *** Final Clinical Impressions(s) / UC Diagnoses   Final diagnoses:  Acute sinusitis with symptoms > 10 days     Discharge Instructions      Symptoms and exam today are consistent with a sinus infection.  Prescription for amoxicillin/clavulanate (antibiotic) was sent to the pharmacy.  Please followup with ENT to discuss next steps for history of sinus cysts.  A nasal steroid spray like flonase and decongestants such as sudafed are helpful in decreasing congestion symptoms, which may also improve infection resistance.  Push fluids and rest.  Take tylenol or advil otc as needed for fever, discomfort.  Eat fruits and vegetables to help your immune system do its best work.  Anticipate gradual improvement over the next several days.  Recheck for new fever >100.5, increasing phlegm production/nasal discharge, or if not starting to improve in a few days.      ED Prescriptions     Medication Sig Dispense Auth. Provider   amoxicillin-clavulanate (AUGMENTIN) 875-125 MG tablet Take 1 tablet by mouth 2 (two) times daily for 10 days. 20 tablet Wynona Luna, MD      PDMP not reviewed this  encounter.

## 2021-10-19 NOTE — Discharge Instructions (Signed)
Symptoms and exam today are consistent with a sinus infection.  Prescription for amoxicillin/clavulanate (antibiotic) was sent to the pharmacy.  Please followup with ENT to discuss next steps for history of sinus cysts.  A nasal steroid spray like flonase and decongestants such as sudafed are helpful in decreasing congestion symptoms, which may also improve infection resistance.  Push fluids and rest.  Take tylenol or advil otc as needed for fever, discomfort.  Eat fruits and vegetables to help your immune system do its best work.  Anticipate gradual improvement over the next several days.  Recheck for new fever >100.5, increasing phlegm production/nasal discharge, or if not starting to improve in a few days.

## 2021-12-23 ENCOUNTER — Ambulatory Visit: Payer: Medicaid Other | Admitting: Family Medicine

## 2021-12-23 ENCOUNTER — Telehealth: Payer: Self-pay

## 2021-12-23 NOTE — Telephone Encounter (Signed)
[place] Night - Client Nonclinical Telephone Record  [facility] Client [facility] Night - Client Client Site [place] - Night Contact Type Call Who Is Calling Patient / Member / Family / Caregiver Caller Name Hardy Phone Number [phone] Patient Name Alexa Rosales Patient DOB Feb 09, 1986 Call Type Message Only Information Provided Reason for Call Request for General Office Information Initial Comment Caller states she wants to know if there are later appointments for today. Disp. Time Disposition Final User 12/23/2021 7:53:57 AM General Information Provided Yes Laurelyn Sickle Call Closed By: Laurelyn Sickle Transaction Date/Time: 12/23/2021 7:52:09 AM (ET

## 2022-01-19 ENCOUNTER — Ambulatory Visit (INDEPENDENT_AMBULATORY_CARE_PROVIDER_SITE_OTHER): Payer: 59 | Admitting: Family Medicine

## 2022-01-19 ENCOUNTER — Encounter: Payer: Self-pay | Admitting: Family Medicine

## 2022-01-19 VITALS — BP 118/70 | HR 81 | Temp 97.5°F | Ht 66.0 in | Wt 192.0 lb

## 2022-01-19 DIAGNOSIS — N898 Other specified noninflammatory disorders of vagina: Secondary | ICD-10-CM

## 2022-01-19 DIAGNOSIS — O24419 Gestational diabetes mellitus in pregnancy, unspecified control: Secondary | ICD-10-CM | POA: Diagnosis not present

## 2022-01-19 DIAGNOSIS — Z13220 Encounter for screening for lipoid disorders: Secondary | ICD-10-CM | POA: Diagnosis not present

## 2022-01-19 DIAGNOSIS — R739 Hyperglycemia, unspecified: Secondary | ICD-10-CM | POA: Diagnosis not present

## 2022-01-19 LAB — POCT GLYCOSYLATED HEMOGLOBIN (HGB A1C): Hemoglobin A1C: 5.6 % (ref 4.0–5.6)

## 2022-01-19 MED ORDER — METRONIDAZOLE 500 MG PO TABS: 500.0000 mg | ORAL_TABLET | Freq: Two times a day (BID) | ORAL | 0 refills | Status: DC

## 2022-01-19 NOTE — Progress Notes (Signed)
Patient ID: Alexa Rosales, female    DOB: December 17, 1985, 36 y.o.   MRN: [id number]  This visit was conducted in person.  BP 118/70 (BP Location: Left Arm, Patient Position: Sitting, Cuff Size: Normal)   Pulse 81   Temp (!) 97.5 F (36.4 C) (Temporal)   Ht '5\' 6"'$  (1.676 m)   Wt 192 lb (87.1 kg)   SpO2 98%   BMI 30.99 kg/m    CC:  Chief Complaint  Patient presents with   Acute Visit    Vaginal discharge x 2 weeks. Patient states its yellowish in color on her panty liner. There is no pain, itching or discomfort.     Subjective:   HPI: Alexa Rosales is a 36 y.o. female presenting on 01/19/2022 for Acute Visit (Vaginal discharge x 2 weeks. Patient states its yellowish in color on her panty liner. There is no pain, itching or discomfort. )   She reports 2 week of vaginal discharge and itching.  Discharge is yellow, has foul odor.   No fever, no dysuria.  No abdominal pain.   History of BV in past... last episode several months ago.  Restarted intercourse several weeks ago.   No know exposure to STDs.   Gestational diabetes vs  type 2 DM.  Has not checked in 5-6 months.. on no medications.        Relevant past medical, surgical, family and social history reviewed and updated as indicated. Interim medical history since our last visit reviewed. Allergies and medications reviewed and updated. Outpatient Medications Prior to Visit  Medication Sig Dispense Refill   albuterol (VENTOLIN HFA) 108 (90 Base) MCG/ACT inhaler Inhale 2 puffs into the lungs every 4 (four) hours as needed. 18 g 0   cetirizine (ZYRTEC) 5 MG tablet Take 5 mg by mouth daily.     Cyanocobalamin (VITAMIN B 12 PO) Take 1,000 mcg by mouth daily at 6 (six) AM.     fluticasone (FLONASE) 50 MCG/ACT nasal spray Place 1-2 sprays into both nostrils daily as needed for allergies or rhinitis.     ipratropium (ATROVENT) 0.06 % nasal spray Place 2 sprays into both nostrils 4 (four) times daily. 15 mL 0   omeprazole  (PRILOSEC) 20 MG capsule Take 20 mg by mouth as needed.     oxyCODONE (OXY IR/ROXICODONE) 5 MG immediate release tablet Take 1-2 tablets (5-10 mg total) by mouth every 4 (four) hours as needed for moderate pain. 30 tablet 0   Prenatal 28-0.8 MG TABS Take 1 tablet by mouth daily.     Spacer/Aero-Holding Chambers (AEROCHAMBER MV) inhaler Use as instructed 1 each 2   ferrous sulfate 325 (65 FE) MG tablet Take 325 mg by mouth daily with breakfast.     No facility-administered medications prior to visit.     Per HPI unless specifically indicated in ROS section below Review of Systems  Constitutional:  Negative for fatigue and fever.  HENT:  Negative for ear pain.   Eyes:  Negative for pain.  Respiratory:  Negative for chest tightness and shortness of breath.   Cardiovascular:  Negative for chest pain, palpitations and leg swelling.  Gastrointestinal:  Negative for abdominal pain.  Genitourinary:  Negative for dysuria.   Objective:  BP 118/70 (BP Location: Left Arm, Patient Position: Sitting, Cuff Size: Normal)   Pulse 81   Temp (!) 97.5 F (36.4 C) (Temporal)   Ht '5\' 6"'$  (1.676 m)   Wt 192 lb (87.1 kg)   SpO2 98%  BMI 30.99 kg/m   Wt Readings from Last 3 Encounters:  01/19/22 192 lb (87.1 kg)  08/27/21 228 lb (103.4 kg)  08/25/21 228 lb (103.4 kg)      Physical Exam Exam conducted with a chaperone present.  Constitutional:      General: She is not in acute distress.    Appearance: Normal appearance. She is well-developed. She is not ill-appearing or toxic-appearing.  HENT:     Head: Normocephalic.     Right Ear: Hearing, tympanic membrane, ear canal and external ear normal. Tympanic membrane is not erythematous, retracted or bulging.     Left Ear: Hearing, tympanic membrane, ear canal and external ear normal. Tympanic membrane is not erythematous, retracted or bulging.     Nose: No mucosal edema or rhinorrhea.     Right Sinus: No maxillary sinus tenderness or frontal sinus  tenderness.     Left Sinus: No maxillary sinus tenderness or frontal sinus tenderness.     Mouth/Throat:     Pharynx: Uvula midline.  Eyes:     General: Lids are normal. Lids are everted, no foreign bodies appreciated.     Conjunctiva/sclera: Conjunctivae normal.     Pupils: Pupils are equal, round, and reactive to light.  Neck:     Thyroid: No thyroid mass or thyromegaly.     Vascular: No carotid bruit.     Trachea: Trachea normal.  Cardiovascular:     Rate and Rhythm: Normal rate and regular rhythm.     Pulses: Normal pulses.     Heart sounds: Normal heart sounds, S1 normal and S2 normal. No murmur heard.    No friction rub. No gallop.  Pulmonary:     Effort: Pulmonary effort is normal. No tachypnea or respiratory distress.     Breath sounds: Normal breath sounds. No decreased breath sounds, wheezing, rhonchi or rales.  Abdominal:     General: Bowel sounds are normal.     Palpations: Abdomen is soft.     Tenderness: There is no abdominal tenderness.  Genitourinary:    Exam position: Supine.     Pubic Area: No rash.      Labia:        Right: No rash.        Left: No rash.      Vagina: Vaginal discharge present. No tenderness.     Cervix: Normal.     Uterus: Normal.      Comments:  White milky discharge Musculoskeletal:     Cervical back: Normal range of motion and neck supple.  Skin:    General: Skin is warm and dry.     Findings: No rash.  Neurological:     Mental Status: She is alert.  Psychiatric:        Mood and Affect: Mood is not anxious or depressed.        Speech: Speech normal.        Behavior: Behavior normal. Behavior is cooperative.        Thought Content: Thought content normal.        Judgment: Judgment normal.       Results for orders placed or performed during the hospital encounter of 08/27/21  CBC  Result Value Ref Range   WBC 11.1 (H) 4.0 - 10.5 K/uL   RBC 4.80 3.87 - 5.11 MIL/uL   Hemoglobin 15.2 (H) 12.0 - 15.0 g/dL   HCT 44.1 36.0 - 46.0  %   MCV 91.9 80.0 - 100.0 fL   MCH  31.7 26.0 - 34.0 pg   MCHC 34.5 30.0 - 36.0 g/dL   RDW 14.0 11.5 - 15.5 %   Platelets 239 150 - 400 K/uL   nRBC 0.0 0.0 - 0.2 %  RPR  Result Value Ref Range   RPR Ser Ql NON REACTIVE NON REACTIVE  Basic metabolic panel  Result Value Ref Range   Sodium 137 135 - 145 mmol/L   Potassium 4.2 3.5 - 5.1 mmol/L   Chloride 111 98 - 111 mmol/L   CO2 22 22 - 32 mmol/L   Glucose, Bld 108 (H) 70 - 99 mg/dL   BUN 12 6 - 20 mg/dL   Creatinine, Ser 0.59 0.44 - 1.00 mg/dL   Calcium 8.9 8.9 - 10.3 mg/dL   GFR, Estimated >60 >60 mL/min   Anion gap 4 (L) 5 - 15  Hemoglobin A1c  Result Value Ref Range   Hgb A1c MFr Bld 5.9 (H) 4.8 - 5.6 %   Mean Plasma Glucose 122.63 mg/dL  Glucose, capillary  Result Value Ref Range   Glucose-Capillary 119 (H) 70 - 99 mg/dL  OB RESULTS CONSOLE GC/Chlamydia  Result Value Ref Range   Gonorrhea Negative    Chlamydia Negative   OB RESULTS CONSOLE RPR  Result Value Ref Range   RPR Nonreactive   OB RESULTS CONSOLE HIV antibody  Result Value Ref Range   HIV Non-reactive   OB RESULTS CONSOLE Rubella Antibody  Result Value Ref Range   Rubella Immune   OB RESULTS CONSOLE Varicella zoster antibody, IgG  Result Value Ref Range   Varicella Immune   OB RESULTS CONSOLE Hepatitis B surface antigen  Result Value Ref Range   Hepatitis B Surface Ag Negative   CBC  Result Value Ref Range   WBC 12.4 (H) 4.0 - 10.5 K/uL   RBC 4.09 3.87 - 5.11 MIL/uL   Hemoglobin 13.0 12.0 - 15.0 g/dL   HCT 38.4 36.0 - 46.0 %   MCV 93.9 80.0 - 100.0 fL   MCH 31.8 26.0 - 34.0 pg   MCHC 33.9 30.0 - 36.0 g/dL   RDW 14.2 11.5 - 15.5 %   Platelets 197 150 - 400 K/uL   nRBC 0.0 0.0 - 0.2 %  Glucose, capillary  Result Value Ref Range   Glucose-Capillary 220 (H) 70 - 99 mg/dL  Glucose, capillary  Result Value Ref Range   Glucose-Capillary 88 70 - 99 mg/dL  Glucose, capillary  Result Value Ref Range   Glucose-Capillary 61 (L) 70 - 99 mg/dL   Glucose, capillary  Result Value Ref Range   Glucose-Capillary 100 (H) 70 - 99 mg/dL  Basic metabolic panel  Result Value Ref Range   Sodium 138 135 - 145 mmol/L   Potassium 4.0 3.5 - 5.1 mmol/L   Chloride 105 98 - 111 mmol/L   CO2 24 22 - 32 mmol/L   Glucose, Bld 139 (H) 70 - 99 mg/dL   BUN 14 6 - 20 mg/dL   Creatinine, Ser 0.86 0.44 - 1.00 mg/dL   Calcium 8.4 (L) 8.9 - 10.3 mg/dL   GFR, Estimated >60 >60 mL/min   Anion gap 9 5 - 15  Glucose, capillary  Result Value Ref Range   Glucose-Capillary 101 (H) 70 - 99 mg/dL  Glucose, capillary  Result Value Ref Range   Glucose-Capillary 124 (H) 70 - 99 mg/dL  Glucose, capillary  Result Value Ref Range   Glucose-Capillary 121 (H) 70 - 99 mg/dL  Glucose, capillary  Result Value  Ref Range   Glucose-Capillary 142 (H) 70 - 99 mg/dL  Glucose, capillary  Result Value Ref Range   Glucose-Capillary 84 70 - 99 mg/dL  Glucose, capillary  Result Value Ref Range   Glucose-Capillary 113 (H) 70 - 99 mg/dL  Type and screen  Result Value Ref Range   ABO/RH(D) O POS    Antibody Screen NEG    Sample Expiration      08/30/2021,2359 Performed at [facility], [address] [place], [place] [address]   Surgical pathology  Result Value Ref Range   SURGICAL PATHOLOGY      SURGICAL PATHOLOGY CASE: [phone] [phone] PATIENT: Mena Pauls Surgical Pathology Report     Specimen Submitted: A. Fallopian tube segment, right B. Fallopian tube segment, left  Clinical History: Sterilization     DIAGNOSIS: A. FALLOPIAN TUBE, RIGHT; PARTIAL SALPINGECTOMY FOR STERILIZATION: - FIMBRIA AND COMPLETE CROSS SECTION OF LUMEN PRESENT.  B. FALLOPIAN TUBE, LEFT; PARTIAL SALPINGECTOMY FOR STERILIZATION: - FIMBRIA AND COMPLETE CROSS SECTION OF LUMEN PRESENT.  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  GROSS DESCRIPTION: A. Labeled: Portion of right fallopian tube Received:  Formalin Collection time: 11:16 AM on 08/28/2021 Placed into formalin time: 11:16 AM on 08/28/2021 Size: 7.5 cm long by 0.6 cm in diameter Other findings: Received is a fimbriated portion of fallopian tube with a purple-tan, smooth, and glistening serosa.  There are multiple paratubal cysts ranging from 0.2 to 0.5 cm in greatest di mension.  The lumen is pinpoint and patent.  Block summary: 1 - fallopian tube fimbria, longitudinally bisected and submitted entirely 2 - representative fallopian tube cross-sections  B. Labeled: Portion of left fallopian tube Received: Formalin Collection time: 11:17 AM on 08/28/2021 Placed into formalin time: 11:17 AM on 08/28/2021 Size: 7.4 cm long by 0.7 cm in diameter Other findings: Received is a fimbriated portion of fallopian tube with a purple-tan, smooth, and glistening serosa.  There are multiple paratubal cysts ranging from 0.2 to 0.5 cm in greatest dimension.  The lumen is pinpoint and patent.  Block summary: 1 - fallopian tube fimbria, longitudinally bisected and submitted entirely, with representative cross-sections  RB 08/30/2021  Final Diagnosis performed by Bryan Lemma, MD.   Electronically signed 08/31/2021 5:49:13PM The electronic signature indicates that the named Attending Pathologist has evaluated the specimen Technical component performed  at [facility], [address] [place], [place] [address] Lab: [phone] Dir: Rush Farmer, MD, MMM  Professional component performed at [facility], [facility], [place], [place], [place] [address] Lab: [phone] Dir: Kathi Simpers, MD      COVID 19 screen:  No recent travel or known exposure to [place] The patient denies respiratory symptoms of COVID 19 at this time. The importance of social distancing was discussed today.   Assessment and Plan    Problem List Items Addressed This Visit     Diabetes in pregnancy    This is significantly improved since  pregnancy and is likely just gestational diabetes.  Her A1c in the office today was 5.6.  We will reevaluate again in 3 months.  She will continue working on low carbohydrate diet, exercise and weight management.      Vaginal discharge - Primary    Acute, symptoms correspond most with bacterial vaginosis.  This is something patient has problem with recurrently in the past.  We will send out wet prep for evaluation for Candida, BV and trichomonas but will treat empirically with metronidazole orally. Patient is  currently breast-feeding and we discussed pumping and dumping breastmilk after each dose of metronidazole versus low risk of oral thrush and diarrhea in the infant.      Relevant Orders   WET PREP BY MOLECULAR PROBE   GC/Chlamydia Probe Amp([facility])   Other Visit Diagnoses     Hyperglycemia       Relevant Orders   HgB A1c      I have excepted her as a new patient but she needs to return for physical and transfer of care in approximately 3 months.  We will do fasting labs prior to that appointment.  Eliezer Lofts, MD

## 2022-01-19 NOTE — Assessment & Plan Note (Signed)
Acute, symptoms correspond most with bacterial vaginosis.  This is something patient has problem with recurrently in the past.  We will send out wet prep for evaluation for Candida, BV and trichomonas but will treat empirically with metronidazole orally. Patient is currently breast-feeding and we discussed pumping and dumping breastmilk after each dose of metronidazole versus low risk of oral thrush and diarrhea in the infant.

## 2022-01-19 NOTE — Assessment & Plan Note (Signed)
This is significantly improved since pregnancy and is likely just gestational diabetes.  Her A1c in the office today was 5.6.  We will reevaluate again in 3 months.  She will continue working on low carbohydrate diet, exercise and weight management.

## 2022-01-19 NOTE — Patient Instructions (Addendum)
I will treat empirically for BV with metronidazole.  We will call with wet prep and GC/Chlam swab results.  Keep working on low carb diet.

## 2022-01-19 NOTE — Addendum Note (Signed)
Addended by: Ellamae Sia on: 01/19/2022 02:29 PM   Modules accepted: Orders

## 2022-01-20 ENCOUNTER — Other Ambulatory Visit: Payer: Self-pay | Admitting: Family Medicine

## 2022-01-20 LAB — WET PREP BY MOLECULAR PROBE
Candida species: NOT DETECTED
MICRO NUMBER:: 14007084
SPECIMEN QUALITY:: ADEQUATE
Trichomonas vaginosis: NOT DETECTED

## 2022-01-20 LAB — C. TRACHOMATIS/N. GONORRHOEAE RNA
C. trachomatis RNA, TMA: NOT DETECTED
N. gonorrhoeae RNA, TMA: NOT DETECTED

## 2022-01-20 MED ORDER — METRONIDAZOLE 0.75 % VA GEL: 1.0000 | Freq: Every day | VAGINAL | 0 refills | Status: DC

## 2022-01-26 ENCOUNTER — Other Ambulatory Visit: Payer: Self-pay

## 2022-01-26 ENCOUNTER — Encounter: Payer: Self-pay | Admitting: Intensive Care

## 2022-01-26 ENCOUNTER — Emergency Department
Admission: EM | Admit: 2022-01-26 | Discharge: 2022-01-26 | Disposition: A | Discharge status: Home / Self Care | Payer: 59 | Attending: Emergency Medicine | Admitting: Emergency Medicine

## 2022-01-26 ENCOUNTER — Emergency Department: Payer: 59

## 2022-01-26 DIAGNOSIS — N939 Abnormal uterine and vaginal bleeding, unspecified: Secondary | ICD-10-CM | POA: Diagnosis present

## 2022-01-26 DIAGNOSIS — D259 Leiomyoma of uterus, unspecified: Secondary | ICD-10-CM | POA: Insufficient documentation

## 2022-01-26 DIAGNOSIS — D219 Benign neoplasm of connective and other soft tissue, unspecified: Secondary | ICD-10-CM

## 2022-01-26 HISTORY — DX: Gestational diabetes mellitus in pregnancy, unspecified control: O24.419

## 2022-01-26 LAB — COMPREHENSIVE METABOLIC PANEL
ALT: 19 U/L (ref 0–44)
AST: 17 U/L (ref 15–41)
Albumin: 4.3 g/dL (ref 3.5–5.0)
Alkaline Phosphatase: 78 U/L (ref 38–126)
Anion gap: 5 (ref 5–15)
BUN: 18 mg/dL (ref 6–20)
CO2: 24 mmol/L (ref 22–32)
Calcium: 8.8 mg/dL — ABNORMAL LOW (ref 8.9–10.3)
Chloride: 108 mmol/L (ref 98–111)
Creatinine, Ser: 0.68 mg/dL (ref 0.44–1.00)
GFR, Estimated: >60 mL/min (ref >60)
Glucose, Bld: 125 mg/dL — ABNORMAL HIGH (ref 70–99)
Potassium: 4 mmol/L (ref 3.5–5.1)
Sodium: 137 mmol/L (ref 135–145)
Total Bilirubin: 1 mg/dL (ref 0.3–1.2)
Total Protein: 7.2 g/dL (ref 6.5–8.1)

## 2022-01-26 LAB — CBC WITH DIFFERENTIAL/PLATELET
Abs Immature Granulocytes: 0.05 10*3/uL (ref 0.00–0.07)
Basophils Absolute: 0 10*3/uL (ref 0.0–0.1)
Basophils Relative: 0 %
Eosinophils Absolute: 0.1 10*3/uL (ref 0.0–0.5)
Eosinophils Relative: 1 %
HCT: 40.5 % (ref 36.0–46.0)
Hemoglobin: 14 g/dL (ref 12.0–15.0)
Immature Granulocytes: 1 %
Lymphocytes Relative: 24 %
Lymphs Abs: 1.9 10*3/uL (ref 0.7–4.0)
MCH: 31.3 pg (ref 26.0–34.0)
MCHC: 34.6 g/dL (ref 30.0–36.0)
MCV: 90.6 fL (ref 80.0–100.0)
Monocytes Absolute: 0.5 10*3/uL (ref 0.1–1.0)
Monocytes Relative: 6 %
Neutro Abs: 5.5 10*3/uL (ref 1.7–7.7)
Neutrophils Relative %: 68 %
Platelets: 262 10*3/uL (ref 150–400)
RBC: 4.47 MIL/uL (ref 3.87–5.11)
RDW: 11.9 % (ref 11.5–15.5)
WBC: 8 10*3/uL (ref 4.0–10.5)
nRBC: 0 % (ref 0.0–0.2)

## 2022-01-26 LAB — URINALYSIS, ROUTINE W REFLEX MICROSCOPIC
Bacteria, UA: NONE SEEN
Bilirubin Urine: NEGATIVE
Glucose, UA: NEGATIVE mg/dL
Ketones, ur: NEGATIVE mg/dL
Leukocytes,Ua: NEGATIVE
Nitrite: NEGATIVE
Protein, ur: 100 mg/dL — AB
RBC / HPF: >50 RBC/hpf — ABNORMAL HIGH (ref 0–5)
Specific Gravity, Urine: 1.035 — ABNORMAL HIGH (ref 1.005–1.030)
pH: 6 (ref 5.0–8.0)

## 2022-01-26 LAB — HCG, QUANTITATIVE, PREGNANCY: hCG, Beta Chain, Quant, S: <1 m[IU]/mL (ref <5)

## 2022-01-26 MED ORDER — MEDROXYPROGESTERONE ACETATE 10 MG PO TABS: 20.0000 mg | ORAL_TABLET | Freq: Three times a day (TID) | ORAL | 0 refills | Status: DC

## 2022-01-26 MED ORDER — MEDROXYPROGESTERONE ACETATE 10 MG PO TABS
20.0000 mg | ORAL_TABLET | Freq: Once | ORAL | Status: AC
  Administered 2022-01-26: 20 mg via ORAL
  Filled 2022-01-26: qty 2

## 2022-01-26 NOTE — ED Triage Notes (Signed)
Patient presents with heavy vaginal bleeding, abdominal cramping, and pain down both legs starting in lower back. Took '400mg'$  motrin around 8am. Reports she started her menstrual cycle yesterday but never bleeds this heavy. Has soaked through a super tampon and pad twice this AM. Reports large amounts of clots when using restroom

## 2022-01-26 NOTE — ED Provider Notes (Signed)
[facility] Provider Note    Event Date/Time   First MD Initiated Contact with Patient 01/26/22 1110     (approximate)   History   Vaginal Bleeding and Abdominal Pain   HPI  Alexa Rosales is a 36 y.o. female comes in with having any vaginal bleeding and abdominal pain low back pain.  She reports last taking Motrin at 8 AM.  Patient reports having multiple episodes of clots and a lot of bleeding that is not typical for her.  She reports a lot of lower abdominal discomfort as well.  She denies this ever happening previously except for maybe 1 time before when she had a Mirena IUD.  She reports a history of tubal ligation.  Denies any new vaginal discharge was just recently tested for STDs which was negative except for being positive for BV which she has been on Flagyl for.  Physical Exam   Triage Vital Signs: ED Triage Vitals  Enc Vitals Group     BP 01/26/22 0933 124/84     Pulse Rate 01/26/22 0933 87     Resp 01/26/22 0933 16     Temp 01/26/22 0933 98.2 F (36.8 C)     Temp Source 01/26/22 0933 Oral     SpO2 01/26/22 0933 97 %     Weight 01/26/22 0937 198 lb (89.8 kg)     Height 01/26/22 0937 '5\' 5"'$  (1.651 m)     Head Circumference --      Peak Flow --      Pain Score 01/26/22 0937 7     Pain Loc --      Pain Edu? --      Excl. in [place]? --     Most recent vital signs: Vitals:   01/26/22 0933  BP: 124/84  Pulse: 87  Resp: 16  Temp: 98.2 F (36.8 C)  SpO2: 97%     General: Awake, no distress.  CV:  Good peripheral perfusion.  Resp:  Normal effort.  Abd:  No distention.  Other:  Large amount of dark venous blood noted.  8 fox swabs with still little bit of pooling in her vaginal vault.   ED Results / Procedures / Treatments   Labs (all labs ordered are listed, but only abnormal results are displayed) Labs Reviewed  COMPREHENSIVE METABOLIC PANEL - Abnormal; Notable for the following components:      Result Value   Glucose, Bld 125  (*)    Calcium 8.8 (*)    All other components within normal limits  CBC WITH DIFFERENTIAL/PLATELET  HCG, QUANTITATIVE, PREGNANCY    RADIOLOGY I have reviewed the ultrasound personally and interpreted   PROCEDURES:  Critical Care performed: No  Procedures   MEDICATIONS ORDERED IN ED: Medications - No data to display   IMPRESSION / MDM / [place] / ED COURSE  I reviewed the triage vital signs and the nursing notes.   Patient's presentation is most consistent with acute presentation with potential threat to life or bodily function.  Patient comes in with heavy vaginal bleeding.  With lots of clots.  Labs ordered evaluate for ectopic pregnancy.  Given lower abdominal discomfort will get ultrasound to rule out fibroids, cyst, ovarian torsion or other acute pathology.  Labs ordered evaluate for any anemia  CMP reassuring.  CBC shows stable hemoglobin patient just had's STD testing done 7 days ago that was negative.  But she was positive for BV and treated with Flagyl  Ultrasound  did show intramural fibroid--reevaluated patient and patient is bleeding has been about 1 to 2 hours per pad but given the significant amount of blood in my examination I did discuss the case with Dr. Glennon Mac.  We discussed Provera given a dose here we could observe patient's bleeding but given she is hemodynamically stable and her hemoglobin is stable could also consider discharge home.  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  We discussed return precautions to come back here for repeat hemoglobin and she expressed understanding but otherwise will give the Provera a try outpatient    FINAL CLINICAL IMPRESSION(S) / ED DIAGNOSES   Final diagnoses:  Vaginal bleeding  Fibroid     Rx / DC Orders   ED Discharge Orders          Ordered    medroxyPROGESTERone (PROVERA) 10 MG tablet  3 times daily        01/26/22 1413             Note:  This document was  prepared using Dragon voice recognition software and may include unintentional dictation errors.   Vanessa Piggott, MD 01/26/22 [phone]

## 2022-01-26 NOTE — ED Provider Triage Note (Signed)
Emergency Medicine Provider Triage Evaluation Note  Alexa Rosales , a 36 y.o. female  was evaluated in triage.  Pt complains of unusually heavy vaginal bleeding that started this morning.  Patient currently being treated for BV and using vaginal gel.  No oral contraceptive as patient had a tubal ligation.  Review of Systems  Positive: + clots, nausea Negative: No pain  Physical Exam  BP 124/84 (BP Location: Right Arm)   Pulse 87   Temp 98.2 F (36.8 C) (Oral)   Resp 16   Ht '5\' 5"'$  (1.651 m)   Wt 89.8 kg   LMP 01/25/2022 (Exact Date)   SpO2 97%   BMI 32.95 kg/m  Gen:   Awake, no distress   Resp:  Normal effort , Lungs clear MSK:   Moves extremities without difficulty  Other:    Medical Decision Making  Medically screening exam initiated at 9:45 AM.  Appropriate orders placed.  Mamta Rimmer was informed that the remainder of the evaluation will be completed by another provider, this initial triage assessment does not replace that evaluation, and the importance of remaining in the ED until their evaluation is complete.     Johnn Hai, PA-C 01/26/22 [phone]

## 2022-01-26 NOTE — Discharge Instructions (Addendum)
Return to the ER if you develop worsening bleeding and he developed lightheadedness weakness or any other concerns therefore we would want to do some repeat blood work and make sure they are not getting more anemic.  We discussed observation with the OB team versus trying the Provera outpatient.  We have opted to do the Provera outpatient.  Please return to the ER for any other concerns  IMPRESSION:  1. No evidence of ovarian torsion.  2. No focal endometrial abnormality.  3. Small volume free fluid in the pelvis, likely physiologic.  4. Small intramural fibroid of the posterior right body of the  uterus.

## 2022-01-27 LAB — URINE CULTURE: Culture: NO GROWTH

## 2022-03-04 ENCOUNTER — Encounter: Payer: Self-pay | Admitting: Family Medicine

## 2022-03-04 ENCOUNTER — Ambulatory Visit (INDEPENDENT_AMBULATORY_CARE_PROVIDER_SITE_OTHER): Payer: 59 | Admitting: Family Medicine

## 2022-03-04 VITALS — BP 110/78 | HR 74 | Temp 98.6°F | Ht 66.0 in | Wt 194.0 lb

## 2022-03-04 DIAGNOSIS — R051 Acute cough: Secondary | ICD-10-CM | POA: Diagnosis not present

## 2022-03-04 MED ORDER — BENZONATATE 200 MG PO CAPS: 200.0000 mg | ORAL_CAPSULE | Freq: Two times a day (BID) | ORAL | 0 refills | Status: DC | PRN

## 2022-03-04 NOTE — Progress Notes (Signed)
Patient ID: Alexa Rosales, female    DOB: Jun 18, 1985, 36 y.o.   MRN: [id number]  This visit was conducted in person.  BP 110/78   Pulse 74   Temp 98.6 F (37 C) (Oral)   Ht '5\' 6"'$  (1.676 m)   Wt 194 lb (88 kg)   SpO2 98%   BMI 31.31 kg/m    CC:  Chief Complaint  Patient presents with   Nasal Congestion    X 4 days   Cough   Ear Pain    Children has RSV    Subjective:   HPI: Alexa Rosales is a 36 y.o. female presenting on 03/04/2022 for Nasal Congestion (X 4 days), Cough, and Ear Pain (Children has RSV)     Date of onset: 4 days ago Initial symptoms included  post nasal drip, ear pressure. Symptoms progressed to cough, dry. Headache.  No fever.  No SOB, no wheeze  Now ear pain resolved, no ST.  Body aches.   Sick contacts:  children with RSV.Marland Kitchen  Dx in past week. COVID testing:   none    She has tried to treat with  Mucinex DM.    No history of chronic lung disease such as asthma or COPD. Has had bronchitis in past. Non-smoker.  Relevant past medical, surgical, family and social history reviewed and updated as indicated. Interim medical history since our last visit reviewed. Allergies and medications reviewed and updated. Outpatient Medications Prior to Visit  Medication Sig Dispense Refill   albuterol (VENTOLIN HFA) 108 (90 Base) MCG/ACT inhaler Inhale 2 puffs into the lungs every 4 (four) hours as needed. 18 g 0   cetirizine (ZYRTEC) 5 MG tablet Take 5 mg by mouth daily.     fluticasone (FLONASE) 50 MCG/ACT nasal spray Place 1-2 sprays into both nostrils daily as needed for allergies or rhinitis.     mupirocin ointment (BACTROBAN) 2 % Apply 1 Application topically 3 (three) times daily.     omeprazole (PRILOSEC) 20 MG capsule Take 20 mg by mouth as needed.     Prenatal 28-0.8 MG TABS Take 1 tablet by mouth daily.     Spacer/Aero-Holding Chambers (AEROCHAMBER MV) inhaler Use as instructed 1 each 2   ipratropium (ATROVENT) 0.06 % nasal spray Place 2 sprays  into both nostrils 4 (four) times daily. 15 mL 0   Cyanocobalamin (VITAMIN B 12 PO) Take 1,000 mcg by mouth daily at 6 (six) AM.     medroxyPROGESTERone (PROVERA) 10 MG tablet Take 2 tablets (20 mg total) by mouth in the morning, at noon, and at bedtime for 7 days. 42 tablet 0   metroNIDAZOLE (METROGEL) 0.75 % vaginal gel Place 1 Applicatorful vaginally at bedtime. 70 g 0   oxyCODONE (OXY IR/ROXICODONE) 5 MG immediate release tablet Take 1-2 tablets (5-10 mg total) by mouth every 4 (four) hours as needed for moderate pain. 30 tablet 0   No facility-administered medications prior to visit.     Per HPI unless specifically indicated in ROS section below Review of Systems  Constitutional:  Negative for fatigue and fever.  HENT:  Positive for congestion.   Eyes:  Negative for pain.  Respiratory:  Positive for cough. Negative for shortness of breath.   Cardiovascular:  Negative for chest pain, palpitations and leg swelling.  Gastrointestinal:  Negative for abdominal pain.  Genitourinary:  Negative for dysuria and vaginal bleeding.  Musculoskeletal:  Negative for back pain.  Neurological:  Negative for syncope, light-headedness and headaches.  Psychiatric/Behavioral:  Negative for dysphoric mood.    Objective:  BP 110/78   Pulse 74   Temp 98.6 F (37 C) (Oral)   Ht '5\' 6"'$  (1.676 m)   Wt 194 lb (88 kg)   SpO2 98%   BMI 31.31 kg/m   Wt Readings from Last 3 Encounters:  03/04/22 194 lb (88 kg)  01/19/22 192 lb (87.1 kg)  08/27/21 228 lb (103.4 kg)      Physical Exam Constitutional:      General: She is not in acute distress.    Appearance: Normal appearance. She is well-developed. She is not ill-appearing or toxic-appearing.  HENT:     Head: Normocephalic.     Right Ear: Hearing, tympanic membrane, ear canal and external ear normal. Tympanic membrane is not erythematous, retracted or bulging.     Left Ear: Hearing, tympanic membrane, ear canal and external ear normal. Tympanic  membrane is not erythematous, retracted or bulging.     Nose: No mucosal edema or rhinorrhea.     Right Sinus: No maxillary sinus tenderness or frontal sinus tenderness.     Left Sinus: No maxillary sinus tenderness or frontal sinus tenderness.     Mouth/Throat:     Pharynx: Uvula midline.  Eyes:     General: Lids are normal. Lids are everted, no foreign bodies appreciated.     Conjunctiva/sclera: Conjunctivae normal.     Pupils: Pupils are equal, round, and reactive to light.  Neck:     Thyroid: No thyroid mass or thyromegaly.     Vascular: No carotid bruit.     Trachea: Trachea normal.  Cardiovascular:     Rate and Rhythm: Normal rate and regular rhythm.     Pulses: Normal pulses.     Heart sounds: Normal heart sounds, S1 normal and S2 normal. No murmur heard.    No friction rub. No gallop.  Pulmonary:     Effort: Pulmonary effort is normal. No tachypnea or respiratory distress.     Breath sounds: Normal breath sounds. No decreased breath sounds, wheezing, rhonchi or rales.  Abdominal:     General: Bowel sounds are normal.     Palpations: Abdomen is soft.     Tenderness: There is no abdominal tenderness.  Musculoskeletal:     Cervical back: Normal range of motion and neck supple.  Skin:    General: Skin is warm and dry.     Findings: No rash.  Neurological:     Mental Status: She is alert.  Psychiatric:        Mood and Affect: Mood is not anxious or depressed.        Speech: Speech normal.        Behavior: Behavior normal. Behavior is cooperative.        Thought Content: Thought content normal.        Judgment: Judgment normal.       Results for orders placed or performed in visit on 01/19/22  WET PREP BY MOLECULAR PROBE   Specimen: Vaginal Fluid  Result Value Ref Range   MICRO NUMBER: [id number]    SPECIMEN QUALITY: Adequate    SOURCE: VAGINA    STATUS: FINAL    Trichomonas vaginosis Not Detected    Gardnerella vaginalis (A)     Detected. Increased levels of G.  vaginalis may not be significant in the absence of signs and symptoms of bacterial vaginosis.   Candida species Not Detected   C. trachomatis/N. gonorrhoeae RNA   Specimen: Vaginal Fluid  Result Value Ref Range   C. trachomatis RNA, TMA NOT DETECTED NOT DETECTED   N. gonorrhoeae RNA, TMA NOT DETECTED NOT DETECTED  HgB A1c  Result Value Ref Range   Hemoglobin A1C 5.6 4.0 - 5.6 %   HbA1c POC (<> result, manual entry)     HbA1c, POC (prediabetic range)     HbA1c, POC (controlled diabetic range)       COVID 19 screen:  No recent travel or known exposure to COVID19 The patient denies respiratory symptoms of COVID 19 at this time. The importance of social distancing was discussed today.   Assessment and Plan    Problem List Items Addressed This Visit     Acute cough - Primary    Acute, likely RSV given multiple family members diagnosed with this. No current shortness of breath or wheeze.  No indication for prednisone.  She will continue doing symptomatic care.  Recommended remaining home from work, note provided.. Can use benzonatate 200 mg p.o. twice daily as needed cough. Return precautions and ER precautions provided       Eliezer Lofts, MD

## 2022-03-04 NOTE — Assessment & Plan Note (Signed)
Acute, likely RSV given multiple family members diagnosed with this. No current shortness of breath or wheeze.  No indication for prednisone.  She will continue doing symptomatic care.  Recommended remaining home from work, note provided.. Can use benzonatate 200 mg p.o. twice daily as needed cough. Return precautions and ER precautions provided

## 2022-04-12 ENCOUNTER — Ambulatory Visit
Admission: EM | Admit: 2022-04-12 | Discharge: 2022-04-12 | Disposition: A | Discharge status: Home / Self Care | Payer: Medicaid Other | Attending: Emergency Medicine | Admitting: Emergency Medicine

## 2022-04-12 ENCOUNTER — Encounter: Payer: Self-pay | Admitting: Emergency Medicine

## 2022-04-12 DIAGNOSIS — R051 Acute cough: Secondary | ICD-10-CM | POA: Diagnosis not present

## 2022-04-12 DIAGNOSIS — J069 Acute upper respiratory infection, unspecified: Secondary | ICD-10-CM

## 2022-04-12 MED ORDER — IPRATROPIUM BROMIDE 0.06 % NA SOLN: 2.0000 | Freq: Four times a day (QID) | NASAL | 12 refills | Status: DC

## 2022-04-12 MED ORDER — BENZONATATE 200 MG PO CAPS: 200.0000 mg | ORAL_CAPSULE | Freq: Two times a day (BID) | ORAL | 0 refills | Status: DC | PRN

## 2022-04-12 MED ORDER — AMOXICILLIN-POT CLAVULANATE 875-125 MG PO TABS: 1.0000 | ORAL_TABLET | Freq: Two times a day (BID) | ORAL | 0 refills | Status: AC

## 2022-04-12 MED ORDER — PROMETHAZINE-DM 6.25-15 MG/5ML PO SYRP: 5.0000 mL | ORAL_SOLUTION | Freq: Four times a day (QID) | ORAL | 0 refills | Status: DC | PRN

## 2022-04-12 NOTE — ED Triage Notes (Signed)
Pt presents with a productive cough, nasal congestion, fever and chest pain while coughing x 5 days.

## 2022-04-12 NOTE — Discharge Instructions (Signed)
The Augmentin twice daily with food for 10 days for treatment of your URI.  Perform sinus irrigation 2-3 times a day with a [employer] sinus rinse kit and distilled water.  Do not use tap water.  You can use plain over-the-counter Mucinex every 6 hours to break up the stickiness of the mucus so your body can clear it.  Increase your oral fluid intake to thin out your mucus so that is also able for your body to clear more easily.  Take an over-the-counter probiotic, such as Culturelle-align-activia, 1 hour after each dose of antibiotic to prevent diarrhea.  Use the Atrovent nasal spray, 2 squirts in each nostril every 6 hours, as needed for runny nose and postnasal drip.  Use the Tessalon Perles every 8 hours during the day.  Take them with a small sip of water.  They may give you some numbness to the base of your tongue or a metallic taste in your mouth, this is normal.  Use the Promethazine DM cough syrup at bedtime for cough and congestion.  It will make you drowsy so do not take it during the day.  If you develop any new or worsening symptoms return for reevaluation or see your primary care provider.  

## 2022-04-12 NOTE — ED Provider Notes (Signed)
[facility]    CSN: [id number] Arrival date & time: 04/12/22  7616      History   Chief Complaint Chief Complaint  Patient presents with   Cough   Fever   Nasal Congestion    HPI Alexa Rosales is a 36 y.o. female.   HPI  36 year old female here for evaluation Rester complaints.  Patient reports that her symptoms began 5 days ago and they consist of nasal congestion with thick green nasal discharge, cough that is productive for clear sputum, and intermittent wheezing.  She denies any fever, ear pain, or shortness of breath.  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  Past Medical History:  Diagnosis Date   Abnormal uterine bleeding (AUB) 12/29/2015   Gestational diabetes    History of anemia    Menorrhagia 11/13/2012    Patient Active Problem List   Diagnosis Date Noted   Acute cough 03/04/2022   Diabetes in pregnancy 08/27/2021   Uterine contractions during pregnancy 08/25/2021   Gestational hypertension without significant proteinuria 08/25/2021   Supervision of high risk pregnancy in third trimester 08/22/2021   AMA (advanced maternal age) multigravida 35+, third trimester 08/22/2021   Preexisting diabetes complicating pregnancy, antepartum 05/06/2021   Obesity in pregnancy, antepartum 05/06/2021   Vaginal discharge 11/03/2020   Chronic rhinitis 07/23/2018   Family history of breast cancer in female 11/15/2011   Previous cesarean delivery affecting pregnancy, antepartum 11/15/2011   Allergic rhinitis 08/25/2011   Genital herpes 08/25/2011    Past Surgical History:  Procedure Laterality Date   CESAREAN SECTION     HERNIA REPAIR     TUBAL LIGATION N/A 08/28/2021   Procedure: POST PARTUM TUBAL LIGATION;  Surgeon: BBenjaman Kindler MD;  Location: [facility] ORS;  Service: Gynecology;  Laterality: N/A;   WISDOM TOOTH EXTRACTION      OB History     Gravida  7   Para  6   Term  4   Preterm  2   AB  1   Living  6       SAB  1   IAB      Ectopic      Multiple  0   Live Births  6        Obstetric Comments  Due Date September 20, 2021          Home Medications    Prior to Admission medications   Medication Sig Start Date End Date Taking? Authorizing Provider  amoxicillin-clavulanate (AUGMENTIN) 875-125 MG tablet Take 1 tablet by mouth every 12 (twelve) hours for 10 days. 04/12/22 04/22/22 Yes RMargarette Canada NP  ipratropium (ATROVENT) 0.06 % nasal spray Place 2 sprays into both nostrils 4 (four) times daily. 04/12/22  Yes RMargarette Canada NP  promethazine-dextromethorphan (PROMETHAZINE-DM) 6.25-15 MG/5ML syrup Take 5 mLs by mouth 4 (four) times daily as needed. 04/12/22  Yes RMargarette Canada NP  albuterol (VENTOLIN HFA) 108 (90 Base) MCG/ACT inhaler Inhale 2 puffs into the lungs every 4 (four) hours as needed. 08/22/21   RMargarette Canada NP  benzonatate (TESSALON) 200 MG capsule Take 1 capsule (200 mg total) by mouth 2 (two) times daily as needed for cough. 04/12/22   RMargarette Canada NP  cetirizine (ZYRTEC) 5 MG tablet Take 5 mg by mouth daily.    [provider]  fluticasone (FLONASE) 50 MCG/ACT nasal spray Place 1-2 sprays into both nostrils daily as needed for allergies or rhinitis.    [provider]  mupirocin ointment (BACTROBAN) 2 % Apply 1 Application topically 3 (three) times daily. 02/22/22   [provider]  omeprazole (PRILOSEC) 20 MG capsule Take 20 mg by mouth as needed.    [provider]  Prenatal 28-0.8 MG TABS Take 1 tablet by mouth daily.    [provider]  Spacer/Aero-Holding Chambers (AEROCHAMBER MV) inhaler Use as instructed 08/22/21   Margarette Canada, NP    Family History Family History  Problem Relation Age of Onset   Diabetes Mother    Hypertension Mother    Sarcoidosis Father    Asthma Sister    Breast cancer Maternal Aunt        great MAT   Diabetes Maternal Grandmother    Colon cancer Maternal Grandmother    Breast cancer Paternal Grandmother      Social History Social History   Tobacco Use   Smoking status: Never   Smokeless tobacco: Never  Vaping Use   Vaping Use: Never used  Substance Use Topics   Alcohol use: Yes    Comment: occassional   Drug use: No     Allergies   Sulfa antibiotics   Review of Systems Review of Systems  Constitutional:  Negative for fever.  HENT:  Positive for congestion, rhinorrhea and sinus pressure. Negative for ear pain.   Respiratory:  Positive for cough and wheezing. Negative for shortness of breath.      Physical Exam Triage Vital Signs ED Triage Vitals  Enc Vitals Group     BP 04/12/22 1111 113/78     Pulse Rate 04/12/22 1111 84     Resp 04/12/22 1111 16     Temp 04/12/22 1111 98.4 F (36.9 C)     Temp Source 04/12/22 1111 Oral     SpO2 04/12/22 1111 96 %     Weight --      Height --      Head Circumference --      Peak Flow --      Pain Score 04/12/22 1110 3     Pain Loc --      Pain Edu? --      Excl. in [place]? --    No data found.  Updated Vital Signs BP 113/78 (BP Location: Right Arm)   Pulse 84   Temp 98.4 F (36.9 C) (Oral)   Resp 16   LMP 04/01/2022 (Approximate)   SpO2 96%   Visual Acuity Right Eye Distance:   Left Eye Distance:   Bilateral Distance:    Right Eye Near:   Left Eye Near:    Bilateral Near:     Physical Exam Vitals and nursing note reviewed.  Constitutional:      Appearance: Normal appearance. She is not ill-appearing.  HENT:     Head: Normocephalic and atraumatic.     Right Ear: Tympanic membrane, ear canal and external ear normal. There is no impacted cerumen.     Left Ear: Tympanic membrane, ear canal and external ear normal. There is no impacted cerumen.     Nose: Congestion and rhinorrhea present.     Comments: Nasal mucosa is erythematous and edematous with purulent discharge in both nares.    Mouth/Throat:     Mouth: Mucous membranes are moist.     Pharynx: Oropharynx is clear. Posterior oropharyngeal erythema  present. No oropharyngeal exudate.     Comments: Posterior oropharynx has erythema and injection yellowish postnasal drip. Cardiovascular:     Rate and Rhythm: Normal rate and regular  rhythm.     Pulses: Normal pulses.     Heart sounds: Normal heart sounds. No murmur heard.    No friction rub. No gallop.  Pulmonary:     Effort: Pulmonary effort is normal.     Breath sounds: No wheezing, rhonchi or rales.  Musculoskeletal:     Cervical back: Normal range of motion and neck supple.  Lymphadenopathy:     Cervical: No cervical adenopathy.  Skin:    General: Skin is warm and dry.     Capillary Refill: Capillary refill takes less than 2 seconds.     Findings: No erythema or rash.  Neurological:     General: No focal deficit present.     Mental Status: She is alert and oriented to person, place, and time.  Psychiatric:        Mood and Affect: Mood normal.        Behavior: Behavior normal.        Thought Content: Thought content normal.        Judgment: Judgment normal.      UC Treatments / Results  Labs (all labs ordered are listed, but only abnormal results are displayed) Labs Reviewed - No data to display  EKG   Radiology No results found.  Procedures Procedures (including critical care time)  Medications Ordered in UC Medications - No data to display  Initial Impression / Assessment and Plan / UC Course  I have reviewed the triage vital signs and the nursing notes.  Pertinent labs & imaging results that were available during my care of the patient were reviewed by me and considered in my medical decision making (see chart for details).   Patient is a pleasant, nontoxic-appearing 12 female here for evaluation of respiratory complaints outlined in HPI above.  Patient is not in any acute distress and she is able to speak in full sentences without dyspnea or tachypnea.  Her upper respiratory tree on physical exam is edematous and erythematous with purulent discharge in  both nares.  She also has purulent postnasal drip.  Her lungs are clear however.  The patient physical exam is consistent with an upper respiratory infection and given the purulence I am suspicious it may be bacterial despite the fact the patient has not had a fever.  I will discharge her home on Augmentin 875 twice daily for 10 days.  I will also prescribe Tessalon Perles and Promethazine DM cough syrup.  I have advised the patient that the Promethazine DM may make her child sleepy and that she should only use at bedtime.  The patient is reporting that she has had difficulty getting sleep due to the cough.  ER and return precautions reviewed.   Final Clinical Impressions(s) / UC Diagnoses   Final diagnoses:  Upper respiratory tract infection, unspecified type  Acute cough     Discharge Instructions      The Augmentin twice daily with food for 10 days for treatment of your URI.  Perform sinus irrigation 2-3 times a day with a [employer] sinus rinse kit and distilled water.  Do not use tap water.  You can use plain over-the-counter Mucinex every 6 hours to break up the stickiness of the mucus so your body can clear it.  Increase your oral fluid intake to thin out your mucus so that is also able for your body to clear more easily.  Take an over-the-counter probiotic, such as Culturelle-align-activia, 1 hour after each dose of antibiotic to prevent diarrhea.  Use the Atrovent nasal spray, 2 squirts in each nostril every 6 hours, as needed for runny nose and postnasal drip.  Use the Tessalon Perles every 8 hours during the day.  Take them with a small sip of water.  They may give you some numbness to the base of your tongue or a metallic taste in your mouth, this is normal.  Use the Promethazine DM cough syrup at bedtime for cough and congestion.  It will make you drowsy so do not take it during the day.  If you develop any new or worsening symptoms return for reevaluation or see your primary  care provider.      ED Prescriptions     Medication Sig Dispense Auth. Provider   benzonatate (TESSALON) 200 MG capsule Take 1 capsule (200 mg total) by mouth 2 (two) times daily as needed for cough. 20 capsule Margarette Canada, NP   amoxicillin-clavulanate (AUGMENTIN) 875-125 MG tablet Take 1 tablet by mouth every 12 (twelve) hours for 10 days. 20 tablet Margarette Canada, NP   ipratropium (ATROVENT) 0.06 % nasal spray Place 2 sprays into both nostrils 4 (four) times daily. 15 mL Margarette Canada, NP   promethazine-dextromethorphan (PROMETHAZINE-DM) 6.25-15 MG/5ML syrup Take 5 mLs by mouth 4 (four) times daily as needed. 118 mL Margarette Canada, NP      PDMP not reviewed this encounter.   Margarette Canada, NP 04/12/22 1152

## 2022-04-27 ENCOUNTER — Telehealth: Payer: Self-pay

## 2022-04-27 NOTE — Telephone Encounter (Signed)
Called pt but vm is full unable to leave a message.

## 2022-04-28 ENCOUNTER — Other Ambulatory Visit (INDEPENDENT_AMBULATORY_CARE_PROVIDER_SITE_OTHER): Payer: BC Managed Care – PPO

## 2022-04-28 ENCOUNTER — Other Ambulatory Visit: Payer: Self-pay | Admitting: Family Medicine

## 2022-04-28 DIAGNOSIS — Z111 Encounter for screening for respiratory tuberculosis: Secondary | ICD-10-CM

## 2022-04-28 NOTE — Progress Notes (Signed)
TB test ordered.

## 2022-04-30 LAB — QUANTIFERON-TB GOLD PLUS
Mitogen-NIL: >10 IU/mL
NIL: 0.05 IU/mL
QuantiFERON-TB Gold Plus: NEGATIVE
TB1-NIL: 0.01 IU/mL
TB2-NIL: 0 IU/mL

## 2022-05-01 ENCOUNTER — Encounter: Payer: Self-pay | Admitting: Family Medicine

## 2022-05-01 ENCOUNTER — Encounter: Payer: Self-pay | Admitting: *Deleted

## 2022-05-25 ENCOUNTER — Encounter: Payer: Medicaid Other | Admitting: Family

## 2022-06-04 ENCOUNTER — Ambulatory Visit
Admission: EM | Admit: 2022-06-04 | Discharge: 2022-06-04 | Disposition: A | Discharge status: Home / Self Care | Payer: BC Managed Care – PPO | Attending: Emergency Medicine | Admitting: Emergency Medicine

## 2022-06-04 DIAGNOSIS — N760 Acute vaginitis: Secondary | ICD-10-CM | POA: Insufficient documentation

## 2022-06-04 DIAGNOSIS — B9689 Other specified bacterial agents as the cause of diseases classified elsewhere: Secondary | ICD-10-CM | POA: Insufficient documentation

## 2022-06-04 LAB — WET PREP, GENITAL
Sperm: NONE SEEN
Trich, Wet Prep: NONE SEEN
WBC, Wet Prep HPF POC: <10 (ref <10)
Yeast Wet Prep HPF POC: NONE SEEN

## 2022-06-04 MED ORDER — METRONIDAZOLE 500 MG PO TABS: 500.0000 mg | ORAL_TABLET | Freq: Two times a day (BID) | ORAL | 0 refills | Status: DC

## 2022-06-04 NOTE — Discharge Instructions (Signed)
Today you are being treated  for  Bacterial vaginosis, negative for yeast and trich  Take Metronidazole 500 mg twice a day for 7 days, do not drink alcohol while using medication, this will make you feel sick   Bacterial vaginosis which results from an overgrowth of one on several organisms that are normally present in your vagina. Vaginosis is an inflammation of the vagina that can result in discharge, itching and pain.  Labs pending  you will be contacted if positive for any sti and treatment will be sent to the pharmacy, you will have to return to the clinic if positive for gonorrhea to receive treatment   Please refrain from having sex until labs results, if positive please refrain from having sex until treatment complete and symptoms resolve   If positive for  Chlamydia  gonorrhea please notify partner or partners so they may tested as well  Moving forward, it is recommended you use some form of protection against the transmission of sti infections  such as condoms or dental dams with each sexual encounter     In addition: Avoid baths, hot tubs and whirlpool spas.  Don't use scented or harsh soaps Avoid irritants. These include scented tampons and pads. Wipe from front to back after using the toilet. Don't douche. Your vagina doesn't require cleansing other than normal bathing.  Use a condom.  Wear cotton underwear, this fabric absorbs some moisture.
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
## 2022-06-06 LAB — CERVICOVAGINAL ANCILLARY ONLY
Chlamydia: NEGATIVE
Comment: NEGATIVE
Comment: NEGATIVE
Comment: NORMAL
Neisseria Gonorrhea: NEGATIVE
Trichomonas: NEGATIVE

## 2022-07-04 ENCOUNTER — Encounter: Payer: BC Managed Care – PPO | Admitting: Family

## 2022-07-06 ENCOUNTER — Encounter: Payer: Self-pay | Admitting: Emergency Medicine

## 2022-07-06 ENCOUNTER — Ambulatory Visit
Admission: EM | Admit: 2022-07-06 | Discharge: 2022-07-06 | Disposition: A | Discharge status: Home / Self Care | Payer: BC Managed Care – PPO | Attending: Family Medicine | Admitting: Family Medicine

## 2022-07-06 ENCOUNTER — Encounter: Payer: Self-pay | Admitting: Otolaryngology

## 2022-07-06 DIAGNOSIS — N760 Acute vaginitis: Secondary | ICD-10-CM | POA: Insufficient documentation

## 2022-07-06 DIAGNOSIS — B9689 Other specified bacterial agents as the cause of diseases classified elsewhere: Secondary | ICD-10-CM | POA: Diagnosis not present

## 2022-07-06 LAB — WET PREP, GENITAL
Sperm: NONE SEEN
Trich, Wet Prep: NONE SEEN
WBC, Wet Prep HPF POC: <10 (ref <10)
Yeast Wet Prep HPF POC: NONE SEEN

## 2022-07-06 MED ORDER — METRONIDAZOLE 0.75 % VA GEL: 1.0000 | Freq: Every day | VAGINAL | 0 refills | Status: AC

## 2022-07-06 NOTE — Discharge Instructions (Addendum)
See handout for bacterial vaginosis (BV).  You have BV and would like the metronidazole gel. Stop by the pharmacy to pick up your prescriptions.  Follow up with your primary care provider as needed. Wear condoms to prevent STDs and throwing your vaginal pH off.

## 2022-07-06 NOTE — ED Provider Notes (Signed)
[facility]    CSN: [id number] Arrival date & time: 07/06/22  1548      History   Chief Complaint Chief Complaint  Patient presents with   Vaginal Discharge     HPI HPI Alexa Rosales is a 37 y.o. female.    Alexa Rosales presents for vaginal odor and discharge for the past week. The past 2 days she has fishy odor. Wears a panty liner with yellow discharge. Denies known STI exposure.   Reports no symptoms in her partner. Alexa Rosales does not use condoms regularly. She is  not currently pregnant.    - Contraception: BTL - Missed period: no Patient's last menstrual period was 06/18/2022. - Abnormal vaginal discharge: yellow and fishy  - vaginal bleeding: no - Dysuria: no - Hematuria: no - Urinary urgency: no - Urinary frequency: no  - Fever: no - Abdominal pain no - Pelvic pain: no - Rash/Skin lesions/mouth ulcers: no - Nausea: no  - Vomiting: no  - Back Pain: no        Past Medical History:  Diagnosis Date   Abnormal uterine bleeding (AUB) 12/29/2015   Gestational diabetes    History of anemia    Menorrhagia 11/13/2012    Patient Active Problem List   Diagnosis Date Noted   Acute cough 03/04/2022   Diabetes in pregnancy 08/27/2021   Uterine contractions during pregnancy 08/25/2021   Gestational hypertension without significant proteinuria 08/25/2021   Supervision of high risk pregnancy in third trimester 08/22/2021   AMA (advanced maternal age) multigravida 35+, third trimester 08/22/2021   Preexisting diabetes complicating pregnancy, antepartum 05/06/2021   Obesity in pregnancy, antepartum 05/06/2021   Vaginal discharge 11/03/2020   Chronic rhinitis 07/23/2018   Family history of breast cancer in female 11/15/2011   Previous cesarean delivery affecting pregnancy, antepartum 11/15/2011   Allergic rhinitis 08/25/2011   Genital herpes 08/25/2011    Past Surgical History:  Procedure Laterality Date   CESAREAN SECTION     HERNIA REPAIR      TUBAL LIGATION N/A 08/28/2021   Procedure: POST PARTUM TUBAL LIGATION;  Surgeon: Benjaman Kindler, MD;  Location: [facility] ORS;  Service: Gynecology;  Laterality: N/A;   WISDOM TOOTH EXTRACTION      OB History     Gravida  7   Para  6   Term  4   Preterm  2   AB  1   Living  6      SAB  1   IAB      Ectopic      Multiple  0   Live Births  6        Obstetric Comments  Due Date September 20, 2021          Home Medications    Prior to Admission medications   Medication Sig Start Date End Date Taking? Authorizing Provider  cetirizine (ZYRTEC) 5 MG tablet Take 5 mg by mouth daily.   Yes [provider]  fluticasone (FLONASE) 50 MCG/ACT nasal spray Place 1-2 sprays into both nostrils daily as needed for allergies or rhinitis.   Yes [provider]  metroNIDAZOLE (METROGEL) 0.75 % vaginal gel Place 1 Applicatorful vaginally at bedtime for 7 days. 07/06/22 07/13/22 Yes Adyen Bifulco, DO  albuterol (VENTOLIN HFA) 108 (90 Base) MCG/ACT inhaler Inhale 2 puffs into the lungs every 4 (four) hours as needed. Patient not taking: Reported on 07/06/2022 08/22/21   Margarette Canada, NP  mupirocin ointment (BACTROBAN) 2 % Apply 1 Application  topically 3 (three) times daily. 02/22/22   [provider]  Prenatal 28-0.8 MG TABS Take 1 tablet by mouth daily.    [provider]    Family History Family History  Problem Relation Age of Onset   Diabetes Mother    Hypertension Mother    Sarcoidosis Father    Asthma Sister    Breast cancer Maternal Aunt        great MAT   Diabetes Maternal Grandmother    Colon cancer Maternal Grandmother    Breast cancer Paternal Grandmother     Social History Social History   Tobacco Use   Smoking status: Never   Smokeless tobacco: Never  Vaping Use   Vaping Use: Never used  Substance Use Topics   Alcohol use: Yes    Comment: occassional   Drug use: No     Allergies   Sulfa antibiotics   Review of  Systems Review of Systems: :negative unless otherwise stated in HPI.      Physical Exam Triage Vital Signs ED Triage Vitals  Enc Vitals Group     BP 07/06/22 1705 (!) 122/90     Pulse Rate 07/06/22 1705 82     Resp 07/06/22 1705 16     Temp 07/06/22 1705 98.1 F (36.7 C)     Temp Source 07/06/22 1705 Oral     SpO2 07/06/22 1705 100 %     Weight --      Height --      Head Circumference --      Peak Flow --      Pain Score 07/06/22 1702 0     Pain Loc --      Pain Edu? --      Excl. in [place]? --    No data found.  Updated Vital Signs BP (!) 122/90 (BP Location: Left Arm)   Pulse 82   Temp 98.1 F (36.7 C) (Oral)   Resp 16   LMP 06/18/2022   SpO2 100%   Visual Acuity Right Eye Distance:   Left Eye Distance:   Bilateral Distance:    Right Eye Near:   Left Eye Near:    Bilateral Near:     Physical Exam GEN: well appearing female in no acute distress  CVS: well perfused  RESP: speaking in full sentences without pause GU: deferred, patient performed self swab    UC Treatments / Results  Labs (all labs ordered are listed, but only abnormal results are displayed) Labs Reviewed  WET PREP, GENITAL - Abnormal; Notable for the following components:      Result Value   Clue Cells Wet Prep HPF POC PRESENT (*)    All other components within normal limits    EKG   Radiology No results found.  Procedures Procedures (including critical care time)  Medications Ordered in UC Medications - No data to display  Initial Impression / Assessment and Plan / UC Course  I have reviewed the triage vital signs and the nursing notes.  Pertinent labs & imaging results that were available during my care of the patient were reviewed by me and considered in my medical decision making (see chart for details).      Patient is a 37 y.o.Marland Kitchen female  who presents for vaginal discharge and odor.  Overall patient is well-appearing and afebrile.  Vital signs stable. Bacterial  vaginitis confirmed on wet prep.   - Treatment: Metro gel x 7 days and advised patient to not drink  alcohol while taking this medication.   Return precautions including abdominal pain, fever, chills, nausea, or vomiting given. Discussed MDM, treatment plan and plan for follow-up with patient who agrees with plan.       Final Clinical Impressions(s) / UC Diagnoses   Final diagnoses:  BV (bacterial vaginosis)     Discharge Instructions      See handout for bacterial vaginosis (BV).  You have BV and would like the metronidazole gel. Stop by the pharmacy to pick up your prescriptions.  Follow up with your primary care provider as needed. Wear condoms to prevent STDs and throwing your vaginal pH off.      ED Prescriptions     Medication Sig Dispense Auth. Provider   metroNIDAZOLE (METROGEL) 0.75 % vaginal gel Place 1 Applicatorful vaginally at bedtime for 7 days. 70 g Lyndee Hensen, DO      PDMP not reviewed this encounter.   Lyndee Hensen, DO 07/06/22 1801

## 2022-07-06 NOTE — ED Triage Notes (Signed)
Pt c/o vaginal discharge with an odor x 4-5 days.

## 2022-07-13 NOTE — Discharge Instructions (Signed)
[facility] ENDOSCOPIC SINUS SURGERY [facility]  What is Functional Endoscopic Sinus Surgery?  The Surgery involves making the natural openings of the sinuses larger by removing the bony partitions that separate the sinuses from the nasal cavity.  The natural sinus lining is preserved as much as possible to allow the sinuses to resume normal function after the surgery.  In some patients nasal polyps (excessively swollen lining of the sinuses) may be removed to relieve obstruction of the sinus openings.  The surgery is performed through the nose using lighted scopes, which eliminates the need for incisions on the face.  A septoplasty is a different procedure which is sometimes performed with sinus surgery.  It involves straightening the boy partition that separates the two sides of your nose.  A crooked or deviated septum may need repair if is obstructing the sinuses or nasal airflow.  Turbinate reduction is also often performed during sinus surgery.  The turbinates are bony proturberances from the side walls of the nose which swell and can obstruct the nose in patients with sinus and allergy problems.  Their size can be surgically reduced to help relieve nasal obstruction.  What Can Sinus Surgery Do For Me?  Sinus surgery can reduce the frequency of sinus infections requiring antibiotic treatment.  This can provide improvement in nasal congestion, post-nasal drainage, facial pressure and nasal obstruction.  Surgery will NOT prevent you from ever having an infection again, so it usually only for patients who get infections 4 or more times yearly requiring antibiotics, or for infections that do not clear with antibiotics.  It will not cure nasal allergies, so patients with allergies may still require medication to treat their allergies after surgery. Surgery may improve headaches related to sinusitis, however, some people will continue to  require medication to control sinus headaches related to allergies.  Surgery will do nothing for other forms of headache (migraine, tension or cluster).  What Are the Risks of Endoscopic Sinus Surgery?  Current techniques allow surgery to be performed safely with little risk, however, there are rare complications that patients should be aware of.  Because the sinuses are located around the eyes, there is risk of eye injury, including blindness, though again, this would be quite rare. This is usually a result of bleeding behind the eye during surgery, which can effect vision, though there are treatments to protect the vision and prevent permanent injury. More serious complications would include bleeding inside the brain cavity or damage to the brain.This happens when the fluid around the brain leaks out into the sinus cavity.  Again, all of these complications are uncommon, and spinal fluid leaks can be safely managed surgically if they occur.  The most common complication of sinus surgery is bleeding from the nose, which may require packing or cauterization of the nose.  Patients with polyps may experience recurrence of the polyps that would require revision surgery.  Alterations of sense of smell or injury to the tear ducts are also rare complications.   What is the Surgery Like, and what is the Recovery?  The Surgery usually takes a couple of hours to perform, and is usually performed under a general anesthetic (completely asleep).  Patients are usually discharged home after a couple of hours.  Sometimes during surgery it is necessary to pack the nose to control bleeding, and the packing is left in place for 24 - 48 hours, and removed by your surgeon.  If   a septoplasty was performed during the procedure, there is often a splint placed which must be removed after 5-7 days.   Discomfort: Pain is usually mild to moderate, and can be controlled by prescription pain medication or acetaminophen (Tylenol).   Aspirin, Ibuprofen (Advil, Motrin), or Naprosyn (Aleve) should be avoided, as they can cause increased bleeding.  Most patients feel sinus pressure like they have a bad head cold for several days.  Sleeping with your head elevated can help reduce swelling and facial pressure, as can ice packs over the face.  A humidifier may be helpful to keep the mucous and blood from drying in the nose.   Diet: There are no specific diet restrictions, however, you should generally start with clear liquids and a light diet of bland foods because the anesthetic can cause some nausea.  Advance your diet depending on how your stomach feels.  Taking your pain medication with food will often help reduce stomach upset which pain medications can cause.  Nasal Saline Irrigation: It is important to remove blood clots and dried mucous from the nose as it is healing.  This is done by having you irrigate the nose at least 3 - 4 times daily with a salt water solution.  We recommend using NeilMed Sinus Rinse (available at the drug store).  Fill the squeeze bottle with the solution, bend over a sink, and insert the tip of the squeeze bottle into the nose  of an inch.  Point the tip of the squeeze bottle towards the inside corner of the eye on the same side your irrigating.  Squeeze the bottle and gently irrigate the nose.  If you bend forward as you do this, most of the fluid will flow back out of the nose, instead of down your throat.   The solution should be warm, near body temperature, when you irrigate.   Each time you irrigate, you should use a full squeeze bottle.   Note that if you are instructed to use Nasal Steroid Sprays at any time after your surgery, irrigate with saline BEFORE using the steroid spray, so you do not wash it all out of the nose. Another product, Nasal Saline Gel (such as AYR Nasal Saline Gel) can be applied in each nostril 3 - 4 times daily to moisture the nose and reduce scabbing or crusting.  Bleeding:   Bloody drainage from the nose can be expected for several days, and patients are instructed to irrigate their nose frequently with salt water to help remove mucous and blood clots.  The drainage may be dark red or brown, though some fresh blood may be seen intermittently, especially after irrigation.  Do not blow you nose, as bleeding may occur. If you must sneeze, keep your mouth open to allow air to escape through your mouth.  If heavy bleeding occurs: Irrigate the nose with saline to rinse out clots, then spray the nose 3 - 4 times with Afrin Nasal Decongestant Spray.  The spray will constrict the blood vessels to slow bleeding.  Pinch the lower half of your nose shut to apply pressure, and lay down with your head elevated.  Ice packs over the nose may help as well. If bleeding persists despite these measures, you should notify your doctor.  Do not use the Afrin routinely to control nasal congestion after surgery, as it can result in worsening congestion and may affect healing.     Activity: Return to work varies among patients. Most patients will be out   of work at least 5 - 7 days to recover.  Patient may return to work after they are off of narcotic pain medication, and feeling well enough to perform the functions of their job.  Patients must avoid heavy lifting (over 10 pounds) or strenuous physical for 2 weeks after surgery, so your employer may need to assign you to light duty, or keep you out of work longer if light duty is not possible.  NOTE: you should not drive, operate dangerous machinery, do any mentally demanding tasks or make any important legal or financial decisions while on narcotic pain medication and recovering from the general anesthetic.    Call Your Doctor Immediately if You Have Any of the Following: Bleeding that you cannot control with the above measures Loss of vision, double vision, bulging of the eye or black eyes. Fever over 101 degrees Neck stiffness with severe headache,  fever, nausea and change in mental state. You are always encouraged to call anytime with concerns, however, please call with requests for pain medication refills during office hours.  Office Endoscopy: During follow-up visits your doctor will remove any packing or splints that may have been placed and evaluate and clean your sinuses endoscopically.  Topical anesthetic will be used to make this as comfortable as possible, though you may want to take your pain medication prior to the visit.  How often this will need to be done varies from patient to patient.  After complete recovery from the surgery, you may need follow-up endoscopy from time to time, particularly if there is concern of recurrent infection or nasal polyps.  

## 2022-07-21 ENCOUNTER — Other Ambulatory Visit: Payer: Self-pay

## 2022-07-21 ENCOUNTER — Ambulatory Visit: Payer: BC Managed Care – PPO | Admitting: Anesthesiology

## 2022-07-21 ENCOUNTER — Encounter
Admission: RE | Disposition: A | Discharge status: Home / Self Care | Payer: Self-pay | Source: Home / Self Care | Attending: Otolaryngology

## 2022-07-21 ENCOUNTER — Ambulatory Visit
Admission: RE | Admit: 2022-07-21 | Discharge: 2022-07-21 | Disposition: A | Discharge status: Home / Self Care | Payer: BC Managed Care – PPO | Attending: Otolaryngology | Admitting: Otolaryngology

## 2022-07-21 ENCOUNTER — Encounter: Payer: Self-pay | Admitting: Otolaryngology

## 2022-07-21 DIAGNOSIS — J341 Cyst and mucocele of nose and nasal sinus: Secondary | ICD-10-CM | POA: Insufficient documentation

## 2022-07-21 DIAGNOSIS — J320 Chronic maxillary sinusitis: Secondary | ICD-10-CM | POA: Diagnosis not present

## 2022-07-21 DIAGNOSIS — Z6832 Body mass index (BMI) 32.0-32.9, adult: Secondary | ICD-10-CM | POA: Diagnosis not present

## 2022-07-21 DIAGNOSIS — E669 Obesity, unspecified: Secondary | ICD-10-CM | POA: Insufficient documentation

## 2022-07-21 DIAGNOSIS — J343 Hypertrophy of nasal turbinates: Secondary | ICD-10-CM | POA: Diagnosis not present

## 2022-07-21 HISTORY — PX: NASAL TURBINATE REDUCTION: SHX2072

## 2022-07-21 HISTORY — PX: MAXILLARY ANTROSTOMY: SHX2003

## 2022-07-21 HISTORY — PX: IMAGE GUIDED SINUS SURGERY: SHX6570

## 2022-07-21 LAB — POCT PREGNANCY, URINE: Preg Test, Ur: NEGATIVE

## 2022-07-21 SURGERY — SINUS SURGERY, WITH IMAGING GUIDANCE
Anesthesia: General | Site: Nose | Laterality: Right

## 2022-07-21 MED ORDER — SUGAMMADEX SODIUM 200 MG/2ML IV SOLN
INTRAVENOUS | Status: DC | PRN
  Administered 2022-07-21: 160 mg via INTRAVENOUS

## 2022-07-21 MED ORDER — PHENYLEPHRINE HCL 0.5 % NA SOLN
NASAL | Status: DC | PRN
  Administered 2022-07-21: 15 mL via TOPICAL

## 2022-07-21 MED ORDER — MIDAZOLAM HCL 5 MG/5ML IJ SOLN
INTRAMUSCULAR | Status: DC | PRN
  Administered 2022-07-21: 2 mg via INTRAVENOUS

## 2022-07-21 MED ORDER — ONDANSETRON HCL 4 MG/2ML IJ SOLN
INTRAMUSCULAR | Status: DC | PRN
  Administered 2022-07-21: 4 mg via INTRAVENOUS

## 2022-07-21 MED ORDER — SCOPOLAMINE 1 MG/3DAYS TD PT72
1.0000 | MEDICATED_PATCH | TRANSDERMAL | Status: DC
  Administered 2022-07-21: 1.5 mg via TRANSDERMAL

## 2022-07-21 MED ORDER — CEPHALEXIN 500 MG PO CAPS: 500.0000 mg | ORAL_CAPSULE | Freq: Two times a day (BID) | ORAL | 0 refills | Status: AC

## 2022-07-21 MED ORDER — LIDOCAINE HCL (CARDIAC) PF 100 MG/5ML IV SOSY
PREFILLED_SYRINGE | INTRAVENOUS | Status: DC | PRN
  Administered 2022-07-21: 50 mg via INTRAVENOUS

## 2022-07-21 MED ORDER — ROCURONIUM BROMIDE 100 MG/10ML IV SOLN
INTRAVENOUS | Status: DC | PRN
  Administered 2022-07-21: 40 mg via INTRAVENOUS
  Administered 2022-07-21: 10 mg via INTRAVENOUS

## 2022-07-21 MED ORDER — LIDOCAINE-EPINEPHRINE 1 %-1:100000 IJ SOLN
INTRAMUSCULAR | Status: DC | PRN
  Administered 2022-07-21: 6 mL

## 2022-07-21 MED ORDER — SCOPOLAMINE 1 MG/3DAYS TD PT72: 1.0000 | MEDICATED_PATCH | TRANSDERMAL | Status: DC

## 2022-07-21 MED ORDER — DEXAMETHASONE SODIUM PHOSPHATE 4 MG/ML IJ SOLN
INTRAMUSCULAR | Status: DC | PRN
  Administered 2022-07-21: 12 mg via INTRAVENOUS

## 2022-07-21 MED ORDER — HYDROCODONE-ACETAMINOPHEN 5-325 MG PO TABS: 1.0000 | ORAL_TABLET | Freq: Four times a day (QID) | ORAL | 0 refills | Status: AC | PRN

## 2022-07-21 MED ORDER — DEXMEDETOMIDINE HCL IN NACL 80 MCG/20ML IV SOLN
INTRAVENOUS | Status: DC | PRN
  Administered 2022-07-21: 8 ug via BUCCAL

## 2022-07-21 MED ORDER — FENTANYL CITRATE (PF) 100 MCG/2ML IJ SOLN
INTRAMUSCULAR | Status: DC | PRN
  Administered 2022-07-21 (×2): 50 ug via INTRAVENOUS

## 2022-07-21 MED ORDER — CEFAZOLIN SODIUM 1 G IJ SOLR
2000.0000 mg | Freq: Once | INTRAMUSCULAR | Status: AC
Start: 2022-07-21 — End: 2022-07-21
  Administered 2022-07-21: 2000 mg via INTRAVENOUS

## 2022-07-21 MED ORDER — LACTATED RINGERS IV SOLN: INTRAVENOUS | Status: DC

## 2022-07-21 MED ORDER — EPHEDRINE SULFATE (PRESSORS) 50 MG/ML IJ SOLN
INTRAMUSCULAR | Status: DC | PRN
  Administered 2022-07-21: 10 mg via INTRAVENOUS

## 2022-07-21 MED ORDER — PREDNISONE 10 MG PO TABS: ORAL_TABLET | ORAL | 0 refills | Status: AC

## 2022-07-21 MED ORDER — ACETAMINOPHEN 10 MG/ML IV SOLN
1000.0000 mg | Freq: Once | INTRAVENOUS | Status: AC
  Administered 2022-07-21: 1000 mg via INTRAVENOUS

## 2022-07-21 MED ORDER — OXYMETAZOLINE HCL 0.05 % NA SOLN
2.0000 | Freq: Once | NASAL | Status: AC
  Administered 2022-07-21: 2 via NASAL

## 2022-07-21 MED ORDER — PROPOFOL 10 MG/ML IV BOLUS
INTRAVENOUS | Status: DC | PRN
  Administered 2022-07-21: 200 mg via INTRAVENOUS

## 2022-07-21 MED ORDER — OXYCODONE HCL 5 MG/5ML PO SOLN
10.0000 mg | Freq: Once | ORAL | Status: AC
  Administered 2022-07-21: 10 mg via ORAL

## 2022-07-21 SURGICAL SUPPLY — 27 items
BLADE SHAVER TRUDI STR 4 (ENT DISPOSABLE) ×3 IMPLANT
CABLE TRUDI DISPOSABLE (ENT DISPOSABLE) ×6 IMPLANT
CANISTER SUCT 1200ML W/VALVE (MISCELLANEOUS) ×3 IMPLANT
COAGULATOR SUCT 8FR VV (MISCELLANEOUS) ×3 IMPLANT
ELECT REM PT RETURN 9FT ADLT (ELECTROSURGICAL) ×3
ELECTRODE REM PT RTRN 9FT ADLT (ELECTROSURGICAL) ×3 IMPLANT
GLOVE SURG GAMMEX PI TX LF 7.5 (GLOVE) ×6 IMPLANT
GOWN STRL REUS W/ TWL LRG LVL3 (GOWN DISPOSABLE) ×3 IMPLANT
GOWN STRL REUS W/TWL LRG LVL3 (GOWN DISPOSABLE) ×3
IV NS 500ML (IV SOLUTION) ×3
IV NS 500ML BAXH (IV SOLUTION) ×3 IMPLANT
KIT TURNOVER KIT A (KITS) ×3 IMPLANT
NDL ANESTHESIA 27G X 3.5 (NEEDLE) ×3 IMPLANT
NDL HYPO 27GX1-1/4 (NEEDLE) ×3 IMPLANT
NEEDLE ANESTHESIA  27G X 3.5 (NEEDLE) ×3
NEEDLE ANESTHESIA 27G X 3.5 (NEEDLE) ×3 IMPLANT
NEEDLE HYPO 27GX1-1/4 (NEEDLE) ×3 IMPLANT
NS IRRIG 500ML POUR BTL (IV SOLUTION) ×3 IMPLANT
PACK ENT CUSTOM (PACKS) ×3 IMPLANT
PACKING NASAL EPIS 4X2.4 XEROG (MISCELLANEOUS) ×6 IMPLANT
PATTIES SURGICAL .5 X3 (DISPOSABLE) ×3 IMPLANT
STRAP BODY AND KNEE 60X3 (MISCELLANEOUS) ×3 IMPLANT
SYR 3ML LL SCALE MARK (SYRINGE) ×3 IMPLANT
TOWEL OR 17X26 4PK STRL BLUE (TOWEL DISPOSABLE) ×3 IMPLANT
TRACKER DISPOSABLE PAITIENT (MISCELLANEOUS) ×3 IMPLANT
TUBING IRRIGATION BIEN-AIR (TUBING) ×3 IMPLANT
WATER STERILE IRR 250ML POUR (IV SOLUTION) ×3 IMPLANT

## 2022-07-21 NOTE — Op Note (Signed)
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  Both of them were filled with clear to light yellow watery fluid.  No sign of infection.  The seem to be attached to the inferior medial walls.  The inferior turbinates were very large  Procedure: Patient was brought to the operating room placed in supine position.  She was given general anesthesia by oral endotracheal intubation.  Once the patient was asleep the image guided system was brought in.  The CT scan was downloaded to the system and then the patient was placed on the large head mat and oriented appropriately.  The face was then registered to the system using the handpiece.  This showed good alignment with the instruments.  The nose was then prepped with Xylocaine and phenylephrine soaked cottonoid pledgets.  She was then prepped and draped in a sterile fashion.  The cotton pledgets were then removed and the 0 degree scope was used to visualize the airways.  3 mL of 1% Xylocaine with epi 1: 100,000 was used for infiltration of the anterior and posterior roots of the middle turbinate and the uncinate process.  The scope shows the inferior turbinates to be quite large, even when decongested.  Trimming of the inferior turbinates was done by making incision in the anterior inferior border and elevating the mucous membrane over some of the bone.  The bone was trimmed slightly and then the remaining bone was  outfractured to lateralize the inferior turbinate.  Electrocautery was used along some of the medial membrane to help shrink this for better airway.  This was done bilaterally.  The 0 degree scope was used the middle turbinate was infractured.  This gave better access to the middle meatus.  A side biter was used for incising the uncinate process and the entire uncinate process was then removed using a 45 degree forceps and the microdebrider.  There was an accessory ostium that was not visible on the scan.  The medial superior wall of the maxillary antrum was removed starting from the accessory ostium and moving anteriorly.  This removed the natural ostium as well to create a much wider opening into the upper medial wall of the maxillary sinus.  Once this was open you could see 2 very large watery polyps that were filling the right maxillary sinus.  There was no sign of infection anywhere.  Using a curved maxillary antral forceps the polyps were grasped and were pulled out.  The mucosa tore and shred in several pieces.  The 30 degree scope was used to visualize this better and to find the remaining pieces of the polyps that were removed.  You could see the entire maxillary sinus now and it was clear.  The polyp seem to be attached to the medial and inferior walls.  The maxillary antrum was wide open and clear.  Xerogel was placed into the middle meatus on the right side help prevent scarring and blood clotting in this area.  There is no significant bleeding.  The nasopharynx was suctioned clear and the inferior turbinates were visualized and these were still lateralized with large open airways on both sides.  The patient was awakened and taken to the recovery room in satisfactory condition.  There were no operative complications.  Dispo:   To PACU to be discharged home  Plan: She will start flushes tomorrow to help wash away the xerogel.  Will see her back in 5 to 6 days to evaluate the airway and make sure  this remains clear.  She will not blow her nose but can sniff and spit all she wishes.  She will rest at home with her head elevated.  She will start prednisone taper tomorrow and use antibiotics for 1 week preventatively.  She has Norco 5/325 for pain to use as needed.  Elon Alas Shantoya Geurts  07/21/2022 12:40 PM

## 2022-07-21 NOTE — H&P (Signed)
H&P has been reviewed and patient reevaluated, no changes necessary. To be downloaded later.  

## 2022-07-21 NOTE — Anesthesia Procedure Notes (Signed)
Procedure Name: Intubation Date/Time: 07/21/2022 11:36 AM  Performed by: Levin Erp, CRNAPre-anesthesia Checklist: Patient identified, Timeout performed, Emergency Drugs available, Suction available and Patient being monitored Patient Re-evaluated:Patient Re-evaluated prior to induction Oxygen Delivery Method: Circle system utilized Preoxygenation: Pre-oxygenation with 100% oxygen Induction Type: IV induction Ventilation: Mask ventilation without difficulty Laryngoscope Size: Mac and 3 Grade View: Grade I Tube type: Oral Rae Tube size: 6.5 mm Number of attempts: 1 Airway Equipment and Method: Stylet Placement Confirmation: ETT inserted through vocal cords under direct vision, positive ETCO2 and breath sounds checked- equal and bilateral Secured at: 20 cm Tube secured with: Tape Dental Injury: Teeth and Oropharynx as per pre-operative assessment

## 2022-07-21 NOTE — Anesthesia Postprocedure Evaluation (Signed)
Anesthesia Post Note  Patient: Alexa Rosales  Procedure(s) Performed: IMAGE GUIDED SINUS SURGERY (Nose) MAXILLARY ANTROSTOMY WITH TISSUE REMOVAL (Right: Nose) INFERIOR TURBINATE SUBMUCOSAL RESECTION (Bilateral: Nose)  Patient location during evaluation: PACU Anesthesia Type: General Level of consciousness: awake and alert Pain management: pain level controlled Vital Signs Assessment: post-procedure vital signs reviewed and stable Respiratory status: spontaneous breathing, nonlabored ventilation, respiratory function stable and patient connected to nasal cannula oxygen Cardiovascular status: blood pressure returned to baseline and stable Postop Assessment: no apparent nausea or vomiting Anesthetic complications: no   No notable events documented.   Last Vitals:  Vitals:   07/21/22 1255 07/21/22 1300  BP:  127/89  Pulse: 92 94  Resp: 19 17  Temp:    SpO2: 97% 95%    Last Pain:  Vitals:   07/21/22 1255  TempSrc:   PainSc: 5                  Gaige Sebo C Thoren Hosang

## 2022-07-21 NOTE — Anesthesia Preprocedure Evaluation (Addendum)
Anesthesia Evaluation  Patient identified by MRN, date of birth, ID band Patient awake    Airway Mallampati: II  TM Distance: >3 FB Neck ROM: Full    Dental no notable dental hx.    Pulmonary  Seasonal allergies, post-nasal drip   breath sounds clear to auscultation       Cardiovascular Exercise Tolerance: Good  Rhythm:Regular Rate:Normal  Patient denies any cardiac/HTN history, not on meds    Neuro/Psych    GI/Hepatic negative GI ROS, Neg liver ROS,,,  Endo/Other  diabetes  Not diabetic at this time; had gestational diabetes  Renal/GU negative Renal ROS     Musculoskeletal   Abdominal  (+) + obese  Peds  Hematology  (+) Blood dyscrasia, anemia Hx anemia from heavy menstrual bleeding   Anesthesia Other Findings History of anemia  Abnormal uterine bleeding (AUB) Menorrhagia  Gestational diabetes    Reproductive/Obstetrics Negative urine pregnancy test today                              Anesthesia Physical Anesthesia Plan  ASA: 2  Anesthesia Plan: General   Post-op Pain Management:    Induction: Intravenous  PONV Risk Score and Plan: 3 and Scopolamine patch - Pre-op and Treatment may vary due to age or medical condition  Airway Management Planned: Oral ETT  Additional Equipment:   Intra-op Plan:   Post-operative Plan:   Informed Consent: I have reviewed the patients History and Physical, chart, labs and discussed the procedure including the risks, benefits and alternatives for the proposed anesthesia with the patient or authorized representative who has indicated his/her understanding and acceptance.       Plan Discussed with: CRNA  Anesthesia Plan Comments:          Anesthesia Quick Evaluation

## 2022-07-21 NOTE — Transfer of Care (Signed)
Immediate Anesthesia Transfer of Care Note  Patient: Alexa Rosales  Procedure(s) Performed: IMAGE GUIDED SINUS SURGERY (Nose) MAXILLARY ANTROSTOMY WITH TISSUE REMOVAL (Right: Nose) INFERIOR TURBINATE SUBMUCOSAL RESECTION (Bilateral: Nose)  Patient Location: PACU  Anesthesia Type: General  Level of Consciousness: drowsy, moves all 4   Airway and Oxygen Therapy: Patient Spontanous Breathing   Post-op Assessment: Post-op Vital signs reviewed, Patient's Cardiovascular Status Stable, Respiratory Function Stable, Patent Airway and No signs of Nausea or vomiting  Post-op Vital Signs: Reviewed and stable  Complications: No notable events documented.

## 2022-07-22 ENCOUNTER — Encounter: Payer: Self-pay | Admitting: Otolaryngology

## 2022-07-25 LAB — SURGICAL PATHOLOGY

## 2022-09-20 ENCOUNTER — Encounter: Payer: Medicaid Other | Admitting: Family

## 2022-10-15 IMAGING — MR MR HEAD WO/W CM
14 series · 48 of 48 positions shown · IV contrast (9ml Gadavist)
Comparison: Same day head CT.

CLINICAL DATA: Neuro deficit, acute stroke suspected.

EXAM:
MRI HEAD WITHOUT AND WITH CONTRAST
TECHNIQUE: Multiplanar, multiecho pulse sequences of the brain and surrounding
structures were obtained without and with intravenous contrast.
CONTRAST:  9mL GADAVIST GADOBUTROL 1 MMOL/ML IV SOLN

[Series 5: ax dwi_tracew · axial · 3.0mm · 0.71mm/px · z∈[-99,+62]mm · 3 of 56 slices shown]
[im 1/56]
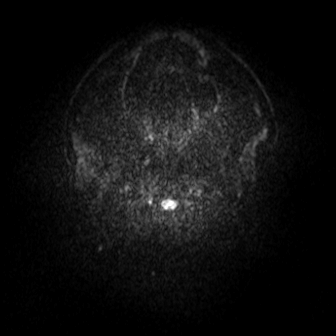
[im 28/56]
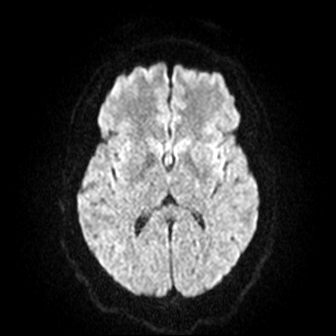
[im 56/56]
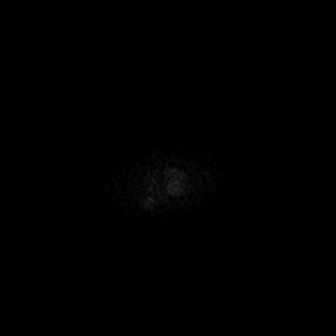

[Series 6: ax dwi_adc · axial · 3.0mm · 0.71mm/px · z∈[-99,+62]mm · 3 of 56 slices shown]
[im 1/56]
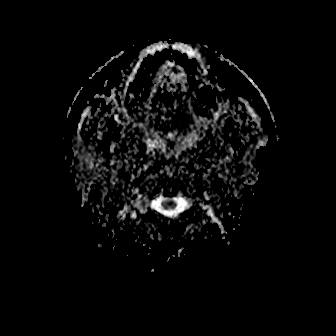
[im 28/56]
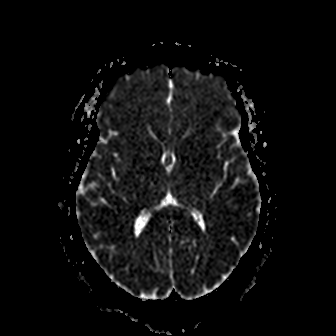
[im 56/56]
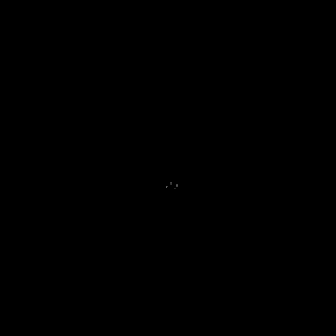

[Series 7: cor dwi_tracew · coronal · 5.0mm · 0.68mm/px · 2 of 40 slices shown]
[im 1/40]
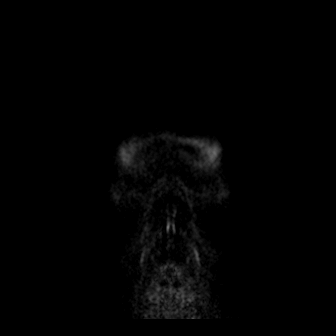
[im 40/40]
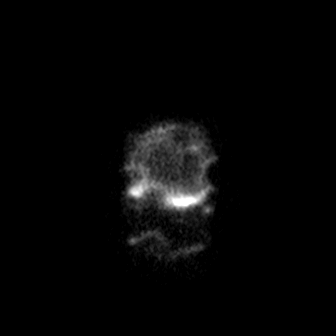

[Series 8: cor dwi_adc · coronal · 5.0mm · 0.68mm/px · 2 of 40 slices shown]
[im 1/40]
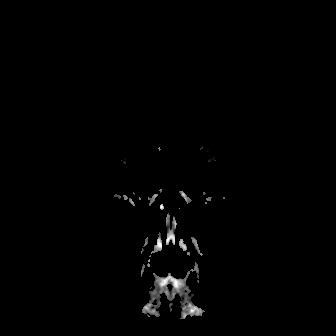
[im 40/40]
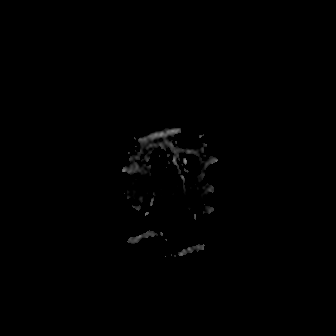

[Series 9: T1 · sagittal · 5.0mm · 0.62mm/px · 1 of 25 slices shown (1 of 2)]
[im 1/25]
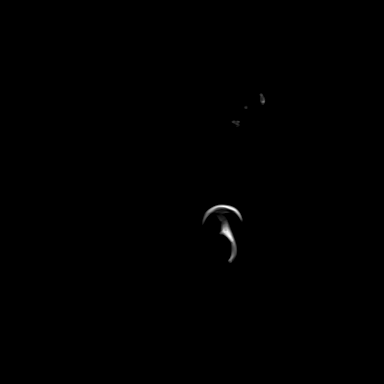

[Series 10: T2 · axial · 5.0mm · 0.53mm/px · 1 of 25 slices shown]
[im 1/25]
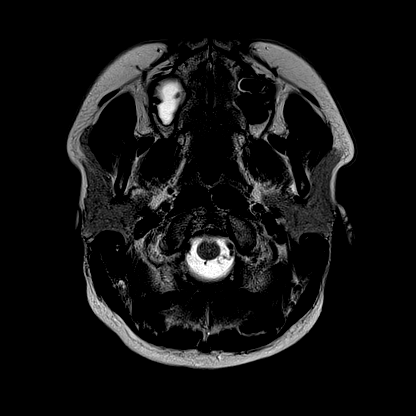

[Series 11: mag_images · axial · 3.0mm · 0.90mm/px · z∈[-103,+70]mm · 3 of 60 slices shown]
[im 1/60]
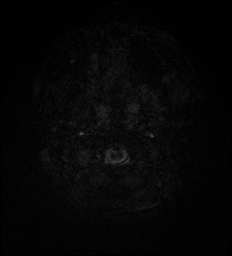
[im 30/60]
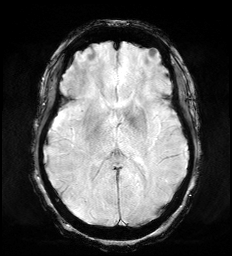
[im 60/60]
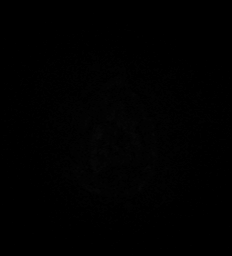

[Series 12: pha_images · axial · 3.0mm · 0.90mm/px · z∈[-103,+70]mm · 3 of 60 slices shown]
[im 1/60]
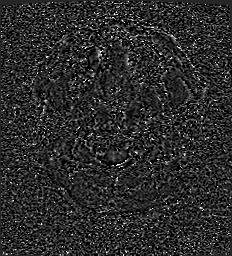
[im 30/60]
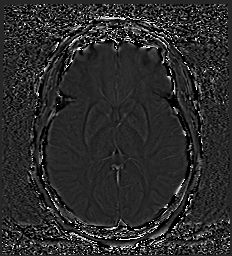
[im 60/60]
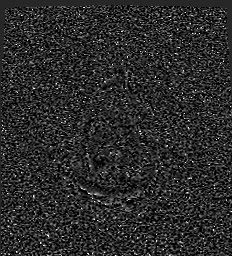

[Series 13: swi_images · axial · 3.0mm · 0.90mm/px · z∈[-103,+70]mm · 3 of 60 slices shown]
[im 1/60]
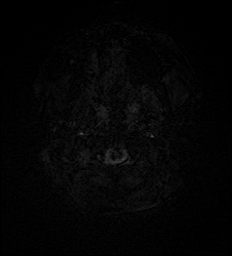
[im 30/60]
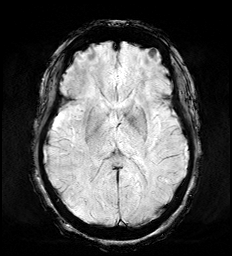
[im 60/60]
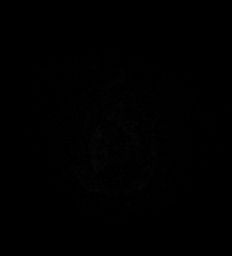

[Series 15: FLAIR · axial · 3.0mm · 0.53mm/px · z∈[-94,+64]mm · 3 of 55 slices shown]
[im 1/55]
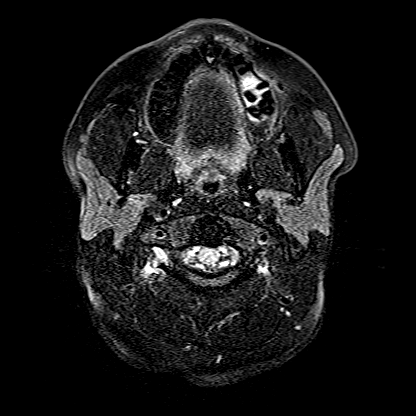
[im 28/55]
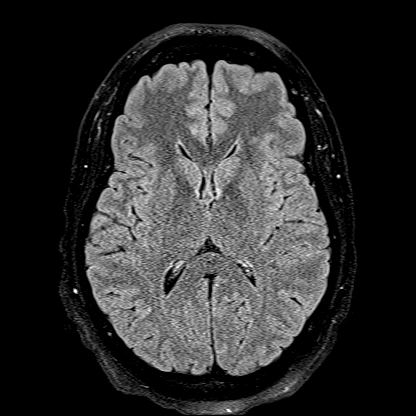
[im 55/55]
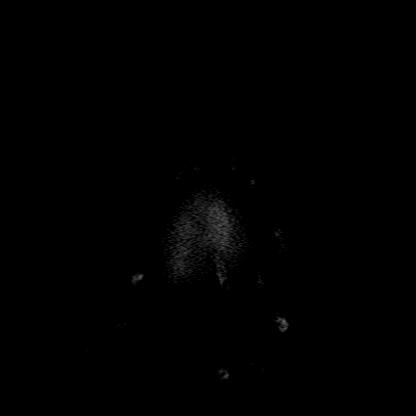

[Series 16: T1 · axial · 1.0mm · 0.98mm/px · z∈[-101,+70]mm · 10 of 176 slices shown (2 of 2)]
[im 1/176]
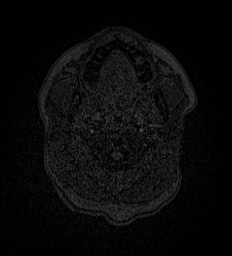
[im 20/176]
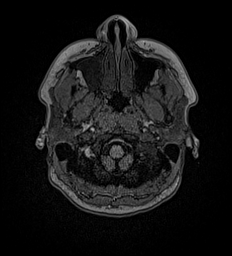
[im 39/176]
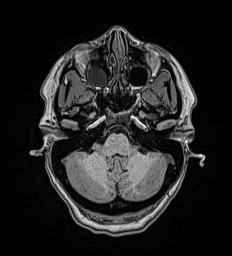
[im 59/176]
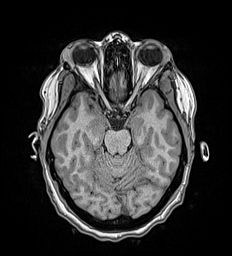
[im 78/176]
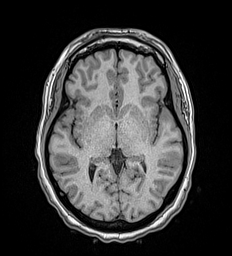
[im 98/176]
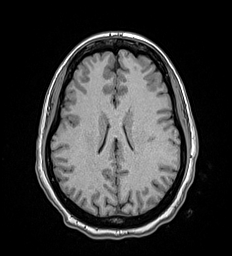
[im 117/176]
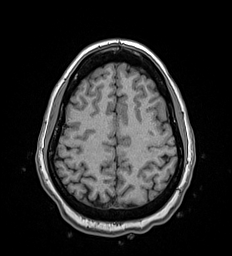
[im 137/176]
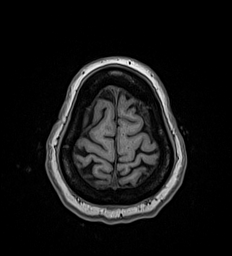
[im 156/176]
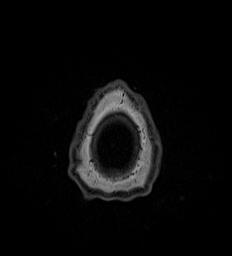
[im 176/176]
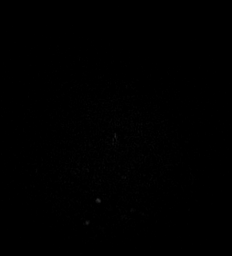

[Series 17: T2 post-contrast · coronal · 5.0mm · 0.57mm/px · 2 of 29 slices shown]
[im 1/29]
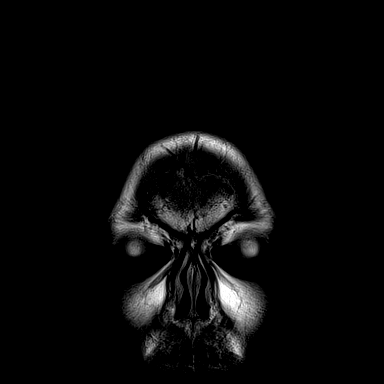
[im 29/29]
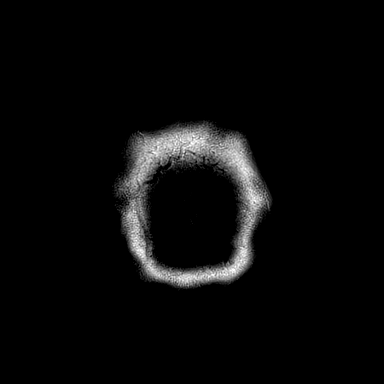

[Series 18: T1 post-contrast · axial · 1.0mm · 0.98mm/px · z∈[-101,+70]mm · 10 of 176 slices shown (1 of 2)]
[im 1/176]
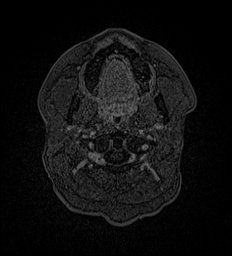
[im 20/176]
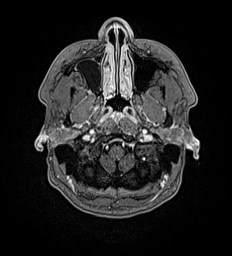
[im 39/176]
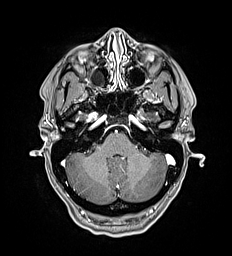
[im 59/176]
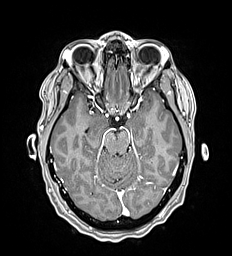
[im 78/176]
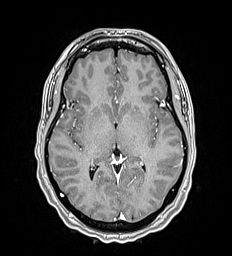
[im 98/176]
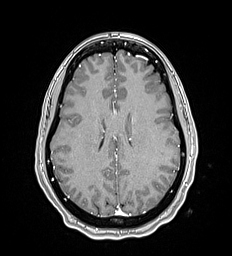
[im 117/176]
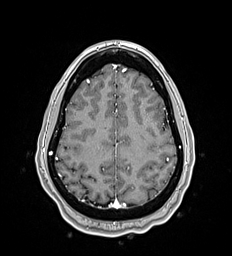
[im 137/176]
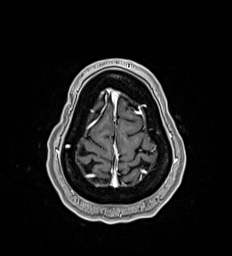
[im 156/176]
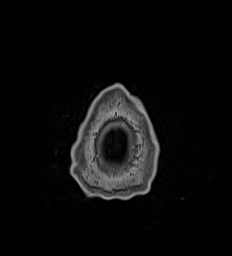
[im 176/176]
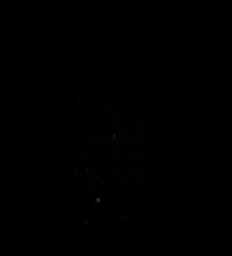

[Series 19: T1 post-contrast · coronal · 5.0mm · 0.57mm/px · 2 of 29 slices shown (2 of 2)]
[im 1/29]
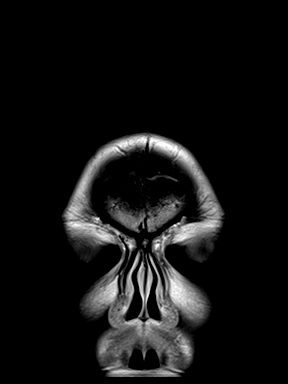
[im 29/29]
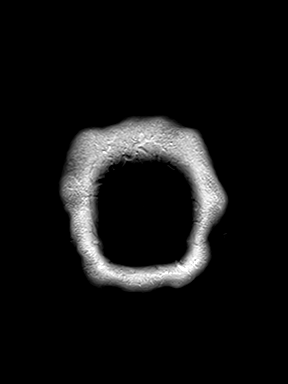

[48 of 48 positions shown; findings below may reference images not displayed]

FINDINGS: Brain: No acute infarction, hemorrhage, hydrocephalus, extra-axial
collection or mass lesion. No abnormal enhancement.

Vascular: Major arterial flow voids are maintained at the skull
base.

Skull and upper cervical spine: Normal marrow signal.

Sinuses/Orbits: Large retention cysts in the right maxillary sinus.

Other: No sizable mastoid effusions.
IMPRESSION: No evidence of acute intracranial abnormality. Specifically, no
acute infarct.
